# Patient Record
Sex: Male | Born: 1935 | Race: Black or African American | Hispanic: No | Marital: Married | Smoking: Former smoker
Health system: Southern US, Community
[De-identification: ages and names within clinical notes are randomized; demographics above are authoritative.]

## PROBLEM LIST (undated history)

## (undated) DIAGNOSIS — IMO0002 Reserved for concepts with insufficient information to code with codable children: Secondary | ICD-10-CM

## (undated) DIAGNOSIS — I739 Peripheral vascular disease, unspecified: Secondary | ICD-10-CM

## (undated) DIAGNOSIS — I10 Essential (primary) hypertension: Secondary | ICD-10-CM

## (undated) DIAGNOSIS — F0390 Unspecified dementia without behavioral disturbance: Secondary | ICD-10-CM

## (undated) DIAGNOSIS — Z89511 Acquired absence of right leg below knee: Secondary | ICD-10-CM

## (undated) DIAGNOSIS — Z89512 Acquired absence of left leg below knee: Secondary | ICD-10-CM

## (undated) DIAGNOSIS — D631 Anemia in chronic kidney disease: Secondary | ICD-10-CM

## (undated) DIAGNOSIS — I509 Heart failure, unspecified: Secondary | ICD-10-CM

## (undated) DIAGNOSIS — N189 Chronic kidney disease, unspecified: Secondary | ICD-10-CM

## (undated) DIAGNOSIS — I69359 Hemiplegia and hemiparesis following cerebral infarction affecting unspecified side: Secondary | ICD-10-CM

## (undated) DIAGNOSIS — Z992 Dependence on renal dialysis: Secondary | ICD-10-CM

## (undated) DIAGNOSIS — IMO0001 Reserved for inherently not codable concepts without codable children: Secondary | ICD-10-CM

## (undated) HISTORY — PX: LEG AMPUTATION BELOW KNEE: SHX694

---

## 2010-10-23 ENCOUNTER — Emergency Department (HOSPITAL_COMMUNITY): Payer: Medicare Other

## 2010-10-23 ENCOUNTER — Inpatient Hospital Stay (HOSPITAL_COMMUNITY)
Admission: EM | Admit: 2010-10-23 | Discharge: 2010-10-27 | Disposition: A | Discharge status: Other Acute Inpatient Hospital | Payer: Medicare Other | Source: Home / Self Care

## 2010-10-23 DIAGNOSIS — N189 Chronic kidney disease, unspecified: Secondary | ICD-10-CM | POA: Diagnosis present

## 2010-10-23 DIAGNOSIS — I4892 Unspecified atrial flutter: Secondary | ICD-10-CM | POA: Diagnosis present

## 2010-10-23 DIAGNOSIS — M869 Osteomyelitis, unspecified: Secondary | ICD-10-CM | POA: Diagnosis present

## 2010-10-23 DIAGNOSIS — I96 Gangrene, not elsewhere classified: Secondary | ICD-10-CM | POA: Diagnosis present

## 2010-10-23 DIAGNOSIS — E1169 Type 2 diabetes mellitus with other specified complication: Secondary | ICD-10-CM | POA: Diagnosis present

## 2010-10-23 DIAGNOSIS — Z87891 Personal history of nicotine dependence: Secondary | ICD-10-CM

## 2010-10-23 DIAGNOSIS — N179 Acute kidney failure, unspecified: Secondary | ICD-10-CM | POA: Diagnosis present

## 2010-10-23 DIAGNOSIS — M9080 Osteopathy in diseases classified elsewhere, unspecified site: Secondary | ICD-10-CM | POA: Diagnosis present

## 2010-10-23 DIAGNOSIS — Z8249 Family history of ischemic heart disease and other diseases of the circulatory system: Secondary | ICD-10-CM

## 2010-10-23 DIAGNOSIS — R7401 Elevation of levels of liver transaminase levels: Secondary | ICD-10-CM | POA: Diagnosis present

## 2010-10-23 DIAGNOSIS — R7402 Elevation of levels of lactic acid dehydrogenase (LDH): Secondary | ICD-10-CM | POA: Diagnosis present

## 2010-10-23 DIAGNOSIS — E1159 Type 2 diabetes mellitus with other circulatory complications: Secondary | ICD-10-CM | POA: Diagnosis present

## 2010-10-23 DIAGNOSIS — Z794 Long term (current) use of insulin: Secondary | ICD-10-CM

## 2010-10-23 DIAGNOSIS — E875 Hyperkalemia: Secondary | ICD-10-CM | POA: Diagnosis present

## 2010-10-23 DIAGNOSIS — R109 Unspecified abdominal pain: Secondary | ICD-10-CM

## 2010-10-23 DIAGNOSIS — I70269 Atherosclerosis of native arteries of extremities with gangrene, unspecified extremity: Secondary | ICD-10-CM | POA: Diagnosis present

## 2010-10-23 DIAGNOSIS — S88119A Complete traumatic amputation at level between knee and ankle, unspecified lower leg, initial encounter: Secondary | ICD-10-CM

## 2010-10-23 DIAGNOSIS — K8020 Calculus of gallbladder without cholecystitis without obstruction: Secondary | ICD-10-CM | POA: Diagnosis present

## 2010-10-23 DIAGNOSIS — Z23 Encounter for immunization: Secondary | ICD-10-CM

## 2010-10-23 DIAGNOSIS — I129 Hypertensive chronic kidney disease with stage 1 through stage 4 chronic kidney disease, or unspecified chronic kidney disease: Secondary | ICD-10-CM | POA: Diagnosis present

## 2010-10-23 DIAGNOSIS — E876 Hypokalemia: Secondary | ICD-10-CM | POA: Diagnosis not present

## 2010-10-23 DIAGNOSIS — Z833 Family history of diabetes mellitus: Secondary | ICD-10-CM

## 2010-10-23 DIAGNOSIS — I4891 Unspecified atrial fibrillation: Secondary | ICD-10-CM | POA: Diagnosis present

## 2010-10-23 DIAGNOSIS — Z7982 Long term (current) use of aspirin: Secondary | ICD-10-CM

## 2010-10-23 LAB — HEPATIC FUNCTION PANEL
ALT: 47 U/L (ref 0–53)
Alkaline Phosphatase: 300 U/L — ABNORMAL HIGH (ref 39–117)
Bilirubin, Direct: 1.6 mg/dL — ABNORMAL HIGH (ref 0.0–0.3)
Indirect Bilirubin: 1.6 mg/dL — ABNORMAL HIGH (ref 0.3–0.9)

## 2010-10-23 LAB — POCT CARDIAC MARKERS: CKMB, poc: 5.9 ng/mL (ref 1.0–8.0)

## 2010-10-23 LAB — BASIC METABOLIC PANEL
BUN: 79 mg/dL — ABNORMAL HIGH (ref 6–23)
Chloride: 110 mEq/L (ref 96–112)
Glucose, Bld: 142 mg/dL — ABNORMAL HIGH (ref 70–99)
Potassium: 5.3 mEq/L — ABNORMAL HIGH (ref 3.5–5.1)

## 2010-10-23 LAB — DIFFERENTIAL
Basophils Absolute: 0 10*3/uL (ref 0.0–0.1)
Basophils Relative: 0 % (ref 0–1)
Eosinophils Absolute: 0.1 10*3/uL (ref 0.0–0.7)
Monocytes Absolute: 0.5 10*3/uL (ref 0.1–1.0)
Neutrophils Relative %: 70 % (ref 43–77)

## 2010-10-23 LAB — CBC
MCH: 31.1 pg (ref 26.0–34.0)
MCV: 91.3 fL (ref 78.0–100.0)
Platelets: 368 10*3/uL (ref 150–400)
RDW: 15.7 % — ABNORMAL HIGH (ref 11.5–15.5)
WBC: 6.1 10*3/uL (ref 4.0–10.5)

## 2010-10-24 DIAGNOSIS — I96 Gangrene, not elsewhere classified: Secondary | ICD-10-CM

## 2010-10-24 LAB — DIFFERENTIAL
Basophils Absolute: 0 10*3/uL (ref 0.0–0.1)
Lymphocytes Relative: 19 % (ref 12–46)
Monocytes Absolute: 0.5 10*3/uL (ref 0.1–1.0)
Monocytes Relative: 9 % (ref 3–12)
Neutro Abs: 4.1 10*3/uL (ref 1.7–7.7)

## 2010-10-24 LAB — GLUCOSE, CAPILLARY: Glucose-Capillary: 132 mg/dL — ABNORMAL HIGH (ref 70–99)

## 2010-10-24 LAB — BASIC METABOLIC PANEL
CO2: 18 mEq/L — ABNORMAL LOW (ref 19–32)
Chloride: 112 mEq/L (ref 96–112)
GFR calc Af Amer: 42 mL/min — ABNORMAL LOW (ref >60)
Potassium: 5 mEq/L (ref 3.5–5.1)
Sodium: 141 mEq/L (ref 135–145)

## 2010-10-24 LAB — CBC
HCT: 41.3 % (ref 39.0–52.0)
Hemoglobin: 13.8 g/dL (ref 13.0–17.0)
MCHC: 33.4 g/dL (ref 30.0–36.0)

## 2010-10-25 LAB — BASIC METABOLIC PANEL
GFR calc non Af Amer: 33 mL/min — ABNORMAL LOW (ref >60)
Glucose, Bld: 109 mg/dL — ABNORMAL HIGH (ref 70–99)
Potassium: 5.1 mEq/L (ref 3.5–5.1)
Sodium: 144 mEq/L (ref 135–145)

## 2010-10-25 LAB — GLUCOSE, CAPILLARY
Glucose-Capillary: 163 mg/dL — ABNORMAL HIGH (ref 70–99)
Glucose-Capillary: 272 mg/dL — ABNORMAL HIGH (ref 70–99)

## 2010-10-26 ENCOUNTER — Inpatient Hospital Stay (HOSPITAL_COMMUNITY): Payer: Medicare Other

## 2010-10-26 LAB — CBC
MCH: 31.1 pg (ref 26.0–34.0)
MCHC: 33.6 g/dL (ref 30.0–36.0)
Platelets: 293 10*3/uL (ref 150–400)
RDW: 15.8 % — ABNORMAL HIGH (ref 11.5–15.5)

## 2010-10-26 LAB — BASIC METABOLIC PANEL
BUN: 40 mg/dL — ABNORMAL HIGH (ref 6–23)
Chloride: 113 mEq/L — ABNORMAL HIGH (ref 96–112)
GFR calc Af Amer: 48 mL/min — ABNORMAL LOW (ref >60)
Potassium: 4.9 mEq/L (ref 3.5–5.1)
Sodium: 142 mEq/L (ref 135–145)

## 2010-10-26 LAB — GLUCOSE, CAPILLARY: Glucose-Capillary: 210 mg/dL — ABNORMAL HIGH (ref 70–99)

## 2010-10-26 LAB — COMPREHENSIVE METABOLIC PANEL
AST: 68 U/L — ABNORMAL HIGH (ref 0–37)
Albumin: 2.6 g/dL — ABNORMAL LOW (ref 3.5–5.2)
BUN: 48 mg/dL — ABNORMAL HIGH (ref 6–23)
Calcium: 9.5 mg/dL (ref 8.4–10.5)
Creatinine, Ser: 1.87 mg/dL — ABNORMAL HIGH (ref 0.4–1.5)
GFR calc Af Amer: 42 mL/min — ABNORMAL LOW (ref >60)

## 2010-10-26 LAB — DIFFERENTIAL
Basophils Relative: 0 % (ref 0–1)
Eosinophils Absolute: 0.2 10*3/uL (ref 0.0–0.7)
Eosinophils Relative: 2 % (ref 0–5)
Monocytes Absolute: 0.8 10*3/uL (ref 0.1–1.0)
Monocytes Relative: 9 % (ref 3–12)
Neutrophils Relative %: 72 % (ref 43–77)

## 2010-10-26 LAB — VANCOMYCIN, RANDOM: Vancomycin Rm: 14.9 ug/mL

## 2010-10-27 ENCOUNTER — Inpatient Hospital Stay (HOSPITAL_COMMUNITY): Payer: Medicare Other

## 2010-10-27 ENCOUNTER — Inpatient Hospital Stay (HOSPITAL_COMMUNITY)
Admission: AD | Admit: 2010-10-27 | Discharge: 2010-11-03 | DRG: 300 | Disposition: A | Discharge status: Home Health | Payer: Medicare Other | Source: Other Acute Inpatient Hospital | Attending: Internal Medicine | Admitting: Internal Medicine

## 2010-10-27 DIAGNOSIS — I70269 Atherosclerosis of native arteries of extremities with gangrene, unspecified extremity: Secondary | ICD-10-CM

## 2010-10-27 LAB — BASIC METABOLIC PANEL
Calcium: 9.2 mg/dL (ref 8.4–10.5)
GFR calc Af Amer: >60 mL/min (ref >60)
GFR calc non Af Amer: 54 mL/min — ABNORMAL LOW (ref >60)
Sodium: 144 mEq/L (ref 135–145)

## 2010-10-27 LAB — GLUCOSE, CAPILLARY
Glucose-Capillary: 80 mg/dL (ref 70–99)
Glucose-Capillary: 229 mg/dL — ABNORMAL HIGH (ref 70–99)
Glucose-Capillary: 258 mg/dL — ABNORMAL HIGH (ref 70–99)

## 2010-10-28 DIAGNOSIS — I70269 Atherosclerosis of native arteries of extremities with gangrene, unspecified extremity: Secondary | ICD-10-CM

## 2010-10-28 LAB — COMPREHENSIVE METABOLIC PANEL
ALT: 36 U/L (ref 0–53)
AST: 65 U/L — ABNORMAL HIGH (ref 0–37)
Albumin: 2.5 g/dL — ABNORMAL LOW (ref 3.5–5.2)
CO2: 20 mEq/L (ref 19–32)
Calcium: 9.3 mg/dL (ref 8.4–10.5)
Chloride: 114 mEq/L — ABNORMAL HIGH (ref 96–112)
GFR calc Af Amer: >60 mL/min (ref >60)
GFR calc non Af Amer: 53 mL/min — ABNORMAL LOW (ref >60)
Sodium: 142 mEq/L (ref 135–145)

## 2010-10-28 LAB — CBC
Hemoglobin: 12.8 g/dL — ABNORMAL LOW (ref 13.0–17.0)
MCH: 31.6 pg (ref 26.0–34.0)
MCV: 90.6 fL (ref 78.0–100.0)
RBC: 4.05 MIL/uL — ABNORMAL LOW (ref 4.22–5.81)
WBC: 9.5 10*3/uL (ref 4.0–10.5)

## 2010-10-28 LAB — HEPATITIS PANEL, ACUTE
Hep A IgM: NEGATIVE
Hepatitis B Surface Ag: NEGATIVE

## 2010-10-28 LAB — GLUCOSE, CAPILLARY
Glucose-Capillary: 127 mg/dL — ABNORMAL HIGH (ref 70–99)
Glucose-Capillary: 78 mg/dL (ref 70–99)

## 2010-10-28 LAB — PROTIME-INR: Prothrombin Time: 14.7 seconds (ref 11.6–15.2)

## 2010-10-28 NOTE — Discharge Summary (Addendum)
  NAME:  [name] [name], [name] [name]                ACCOUNT NO.:  [id number]  MEDICAL RECORD NO.:  [id number]           PATIENT TYPE:  I  LOCATION:  A312                          FACILITY:  [facility]  PHYSICIAN:  [facility]      DATE OF BIRTH:  03-Jan-1925  DATE OF ADMISSION:  10/23/2010 DATE OF DISCHARGE:  LH                              DISCHARGE SUMMARY   ADDENDUM:  CURRENT MEDICATIONS: 1. Aspirin 81 mg p.o. daily. 2. Cilostazol 100 mg p.o. b.i.d. 3. Cardizem 90 mg p.o. t.i.d. 4. Lantus 20 units subcu at bedtime. 5. Mirtazapine 15 mg p.o. at bedtime. 6. Zosyn per pharmacy. 7. Vancomycin per pharmacy. 8. Oxycodone 5 every 4 hours p.r.n. for pain.     Hartley Barefoot, MD   ______________________________ [facility]    BR/MEDQ  D:  10/27/2010  T:  10/27/2010  Job:  [id number]  Electronically Signed by Hartley Barefoot MD on 10/28/2010 11:21:32 AM

## 2010-10-28 NOTE — Discharge Summary (Addendum)
NAME:  [name] [name], [name] [name]                ACCOUNT NO.:  [id number]  MEDICAL RECORD NO.:  [id number]           PATIENT TYPE:  I  LOCATION:  A312                          FACILITY:  [facility]  PHYSICIAN:  Hartley Barefoot, MD    DATE OF BIRTH:  [birth year]  DATE OF ADMISSION:  10/23/2010 DATE OF DISCHARGE:  03/01/2012LH                              DISCHARGE SUMMARY/ Transfer Note.   REASON FOR TRANSFER:  The patient need evaluation by Vascular. Please contact Dr. Leonides Sake and let him know that the patient is at [facility].  ADMISSION DIAGNOSES: 1. Fourth and fifth toes dry gangrene and questionable osteomyelitis. 2. Acute on chronic renal failure. 3. Transaminases. 4. Diabetes. 5. Atrial flutter/rapid ventricular response. 6. Peripheral vascular disease. 7. 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. 8. Atrial fibrillation.  BRIEF HISTORY OF PRESENT ILLNESS:  This is an 77 year old with past medical history significant for peripheral vascular disease status post left lower extremity below-the-knee amputation, diabetes, hypertension, who presented to the [facility] emergency department complaining of worsening pain of a right lower extremity wound.  He relates that he has had discoloration of his right fourth and fifth toes since 3 weeks prior to admission.  He is complaining of pain.  A home health nurse that has been doing wound care to his lower extremity advised him to come to the emergency department.  He was found to be in atrial flutter with RVR.  RADIOGRAPHIC STUDIES: 1. Chest x-ray on October 23, 2010, showed no acute abnormality. 2. Right foot x-ray on October 23, 2010, showed possible     osteomyelitis of the fourth toe. 3. Arterial ABI, non-calculable ABI due to vascular     noncompressibility, probably secondary to medial calcification.     Consider right lower extremity MRA to better define  the side and     nature of arterial occlusive disease and delineate treatment     options. 4. Abdominal ultrasound showed cholelithiasis without evidence of     acute cholecystitis or biliary obstruction.  Right renal cyst.     Question of elevated right heart pressure.  Question of calcified     granulomata unexplained, with generally inhomogeneous splenic     parenchymal echogenicity, nonspecific but making it difficult to     exclude seroma lesion.  No visualization of the pancreas and aorta.     Better intra-abdominal imaging is required.  Recommend CT with IV     and oral contrast for further assessment.  ASSESSMENT AND PLAN: 1. Fourth and fifth toes dry gangrene, questionable osteomyelitis of     the fourth toe.  The patient was admitted.  He was started on     vancomycin and Zosyn.  Today, October 27, 2010, is day 4 of IV     antibiotics.  Surgery was consulted and they were planning to do     amputation of the fourth and fifth toes.  They think that the dry     gangrene is may be from distal embolization.  ABIs done on     October 26, 2010,  showed possible medial calcification and     they recommended MRA.  I discussed the case with Dr. Imogene Burn,     Vascular at [facility] and he recommended further vascular     evaluation.  He would like to see the patient and he is may be     planning to do a right lower extremity arteriogram and further     treatment will depend on vascular evaluation.  The patient will be     transferred to [facility] for further care.  We will need to inform     Dr. Imogene Burn that the patient has arrived to [facility]. 2. Acute on chronic renal failure.  On admission, the patient's     creatinine was at 2.  With gentle hydration and stopping     spironolactone and lisinopril, his renal function has improved.  On     October 27, 2010, creatinine is at 1.27.  The patient does have a     history of chronic renal failure.  I will avoid to use a lot of     amount of  contrast on this patient.  I will continue to hold     Aldactone and lisinopril.  The patient at this time is on normal     saline lock. 3. Atrial flutter/rapid ventricular response.  On admission, his heart     rate was in the 140s.  He was started initially on Cardizem drip     and admitted to the Step-down Unit.  His heart rate decreased to     the 80s and 60s.  He was transitioned to oral Cardizem at 60 mg     t.i.d.  Today, October 27, 2010, his heart rate started to increase     again and it has been from 112-120.  I increased his Cardizem to 90     mg q.i.d.  After that, his heart rate has been in the 100-110.  I     will give him another dose prior to his transfer to [facility] and     if his heart rate is on reasonable rate, I will transfer him to     [facility]. 4. Hyperkalemia.  The patient had potassium at 5.0-5.6.  This was     likely a combination of medication and acute renal failure.  After     stopping Aldactone and lisinopril, his potassium has decreased.  On     the day of transfer, his potassium is at 4.3. 5. Transaminases.  The patient denies any right upper quadrant     abdominal pain.  Viral hepatitis panel is pending.  Abdominal     ultrasound showed cholelithiasis, no cholecystitis or biliary     obstruction.  He will need a repeated liver function test in the     morning.  We will need to follow viral hepatitis panel.  Further     evaluation depending on results of repeated liver function tests. 6. Questionable calcified granuloma in spleen, versus questionable     mass.  Abdominal ultrasound showed questionable calcified granuloma     in spleen, nonspecific, making it difficult to exclude mass.     They were recommending CT with IV contrast, but due to the     patient's renal failure, MRI was ordered.  MRI showed indeterminate     lesion within the spleen, likely represent cyst or hemangioma.  Differential will also include abscess or granulomatous disease or      sarcoid.  The patient has been afebrile, no leukocytosis, less     likely abscess. 7. On the date of transfer, the patient was in stable condition.  We     will transfer him if his heart rate is in the appropriate range.      He need to be transferred for further vascular evaluation.     Blood pressure 113/62, heart rate 90-104, sat 97% on room air,     temperature 97.4, respirations 18.  Labs; sodium 144, potassium     4.3, chloride 114, CO2 of 19, glucose 85, BUN 25, creatinine 1.27.     Hartley Barefoot, MD     BR/MEDQ  D:  10/27/2010  T:  10/27/2010  Job:  [id number]  Electronically Signed by Hartley Barefoot MD on 10/28/2010 11:28:15 AM

## 2010-10-29 LAB — GLUCOSE, CAPILLARY
Glucose-Capillary: 122 mg/dL — ABNORMAL HIGH (ref 70–99)
Glucose-Capillary: 200 mg/dL — ABNORMAL HIGH (ref 70–99)
Glucose-Capillary: 123 mg/dL — ABNORMAL HIGH (ref 70–99)
Glucose-Capillary: 139 mg/dL — ABNORMAL HIGH (ref 70–99)

## 2010-10-30 LAB — BASIC METABOLIC PANEL
Calcium: 8.9 mg/dL (ref 8.4–10.5)
GFR calc Af Amer: 54 mL/min — ABNORMAL LOW (ref >60)
GFR calc non Af Amer: 44 mL/min — ABNORMAL LOW (ref >60)
Potassium: 3.8 mEq/L (ref 3.5–5.1)
Sodium: 143 mEq/L (ref 135–145)

## 2010-10-30 LAB — GLUCOSE, CAPILLARY
Glucose-Capillary: 213 mg/dL — ABNORMAL HIGH (ref 70–99)
Glucose-Capillary: 114 mg/dL — ABNORMAL HIGH (ref 70–99)

## 2010-10-30 LAB — CBC
HCT: 34.2 % — ABNORMAL LOW (ref 39.0–52.0)
MCHC: 34.2 g/dL (ref 30.0–36.0)
Platelets: 230 10*3/uL (ref 150–400)
RDW: 15.4 % (ref 11.5–15.5)
WBC: 7.8 10*3/uL (ref 4.0–10.5)

## 2010-10-31 DIAGNOSIS — I70269 Atherosclerosis of native arteries of extremities with gangrene, unspecified extremity: Secondary | ICD-10-CM

## 2010-10-31 LAB — GLUCOSE, CAPILLARY: Glucose-Capillary: 151 mg/dL — ABNORMAL HIGH (ref 70–99)

## 2010-10-31 LAB — BASIC METABOLIC PANEL
CO2: 25 mEq/L (ref 19–32)
Chloride: 112 mEq/L (ref 96–112)
GFR calc Af Amer: >60 mL/min (ref >60)
Sodium: 143 mEq/L (ref 135–145)

## 2010-10-31 LAB — CBC
Hemoglobin: 11.5 g/dL — ABNORMAL LOW (ref 13.0–17.0)
MCH: 31.2 pg (ref 26.0–34.0)
RBC: 3.69 MIL/uL — ABNORMAL LOW (ref 4.22–5.81)

## 2010-10-31 LAB — PROTIME-INR: Prothrombin Time: 14.5 seconds (ref 11.6–15.2)

## 2010-10-31 NOTE — Consult Note (Signed)
[name] [name], [name] [name]                ACCOUNT NO.:  [id number]  MEDICAL RECORD NO.:  [id number]           PATIENT TYPE:  I  LOCATION:  2029                         FACILITY:  [facility]  PHYSICIAN:  Fransisco Hertz, MD       DATE OF BIRTH:  [birth year]  DATE OF CONSULTATION:  10/27/2010 DATE OF DISCHARGE:                                CONSULTATION   The consultation is received from the Hospitalist Service at [facility].  REASON FOR CONSULTATION:  Right foot gangrene.  HISTORY OF PRESENT ILLNESS:  This is an 77 year old gentleman, transferred from an outside facility with known peripheral arterial disease that presents with chief complaint of right toes turning black. This gentleman previously has undergone what sounds like a previous attempted bypass in the left leg that eventually failed, and he went on to require amputation on that side.  He notes about 3 weeks ago started developing ischemic changes and his right foot was undergoing wound care and over 3 weeks it has progressed to loss of his great toe nail and also development of black tissue on his lateral surface of his foot and also his fifth and fourth toes, which he attributes to overtight bandages.  At this point, he does not claim any pain in the foot per se and he denies any significant history of claudication.  He, of note, is somewhat limited as a historian as his memory is somewhat limited. Apparently, the majority of his care he gets from [facility].  He denies any fever, chills, or any drainage from this right foot.  PAST MEDICAL HISTORY:  Diabetes, peripheral arterial disease, hypertension, and atrial fibrillation.  PAST SURGICAL HISTORY:  Some type of bypass procedure in the left leg and then a BKA of his left leg.  SOCIAL HISTORY:  He smoked greater than 30 years ago.  Denies any alcohol or illicit drug use.  MEDICATIONS ON TRANSFER:  Aspirin, Pletal, Cardizem, Lantus, Zosyn, vancomycin, oxycodone, and  mirtazapine.  He has no known drug allergies.  FAMILY HISTORY:  The patient is not aware of the medical problems of his father or mother.  REVIEW OF SYSTEMS:  He noted the above items, but otherwise the rest of his review of systems is noted to be negative.  PHYSICAL EXAMINATION:  VITAL SIGNS:  Temperature 98.2, blood pressure 99/63, heart rate of 100, respirations 19, satting 100% on room air. GENERAL:  Appears elderly, in no apparent distress, alert and oriented x3. HEAD:  He is normocephalic, atraumatic.  Some temporalis wasting. ENT:  His oropharynx had no erythema or any exudate.  He has some caries in the sheath.  Nares are without any drainage or any erythema.  Hearing is grossly intact. EYES:  His pupils are equal, round, reactive to light.  Extraocular movements are intact. NECK:  Supple without any nuchal rigidity.  No obvious JVD. PULMONARY:  He has symmetric expansion.  Good air movement.  No rales, rhonchi, or wheezing noted. CARDIAC:  Regular rate and rhythm.  Normal S1 and S2.  No murmurs, rubs, thrills, or gallops are appreciated. VASCULAR:  He does not have  easily palpable radial on his right.  He has easily palpable brachial on his right, however.  I could feel his left radial and his brachial.  Carotids are bilaterally palpable without any bruits. I cannot appreciate his abdominal aorta.  The femorals are easily palpable bilaterally.  I think I feel a faint popliteal on the right. There is no palpable popliteal on the left side.  There are no palpable pedals on the right foot, and there is no foot on the left side as is the BKA. ABDOMINAL:  Soft, nontender, nondistended.  No guarding.  No rebound. No hepatosplenomegaly.  No obvious masses. MUSCULOSKELETAL:  The right foot has at least 4/5 dorsal flexion, plantar flexion.  He has 5/5 hand grip bilaterally.  His right foot demonstrate dry gangrene at the great toe nail base and the entirety of the fourth and  fifth toes and also on the lateral surface of this foot, at roughly the margin of a TMA.  He also has chronic ischemic changes in the skin of the right foot. The left BKA is viable. NEUROLOGIC:  Cranial nerves II-XII are intact.  Motor is as listed above.  Sensation is intact including the right foot, which he still has intact light touch to this foot. PSYCH:  Judgment appear to be intact.  His mood and affect are appropriate for his clinical situation. LYMPHATIC:  He has no cervical axillary or inguinal lymphadenopathy.  He had some laboratory studies including a sodium 144, potassium 4.3, chloride 114, bicarb 19, glucose 85, BUN 25, creatinine is 1.27.  He had a CBC that was done with white count of 8.2, H&H of 13.2 and 39.3, platelets of 293.  He also had what looks like an outside ABI done on February 29, which shows noncompressible vessels, so no ABI was calculated.  No waveforms were noted in this study.  He also had right foot films that demonstrated possible osteomyelitis of the fourth toe on the right side.  MEDICAL DECISION MAKING:  This is an 77 year old gentleman who presents with known peripheral arterial disease that looks like multiple comorbidities including baseline chronic renal insufficiency.  At this point, it looks like most of it has resolved with the creatinine down to 1.3.  This gentleman has extensive dry gangrene in his left foot and ischemic changes.  I do not know if he has a salvageable foot at this point.  The lateral surface gangrene is already at the region of which the TMA would be completed, so I am not certain if he has an adequate plantar surface to complete the TMA successfully.  I would proceed forward with arterial duplex on the right leg as the ABI is going to be useless with his calcific disease.  I would continue hydration in terms of his renal function to improve his chances of avoiding contrast nephropathy.  Unfortunately, at the earliest I  will be able to get him on for a diagnostic angiogram, which is Monday, so we will start the bicarb drip on Sunday and then do angio on Monday with both CO2 and limited amount of dye to fully evaluate this leg and then we will have answer in terms of what his revascularization options are.  I have already discussed with the patient that there is a good chance that he will require right below-the-knee amputation, but he would like attempt at salvage of his foot if at all possible.  We will continue to keep following this gentleman.  At this point, there  is no need for proceeding with any type of guillotine amputation for infection control as the foot demonstrated no evidence of any type of wet gangrene. Additionally, this is not a threatened foot as the leg is warm and he has full motor and sensation intact.  Thank you, once again, for giving the opportunity to participate in this patient's care.     Fransisco Hertz, MD    BLC/MEDQ  D:  10/28/2010  T:  10/28/2010  Job:  [id number]  Electronically Signed by Leonides Sake MD on 10/31/2010 04:41:22 PM

## 2010-11-01 DIAGNOSIS — I70269 Atherosclerosis of native arteries of extremities with gangrene, unspecified extremity: Secondary | ICD-10-CM

## 2010-11-01 LAB — GLUCOSE, CAPILLARY
Glucose-Capillary: 102 mg/dL — ABNORMAL HIGH (ref 70–99)
Glucose-Capillary: 45 mg/dL — ABNORMAL LOW (ref 70–99)

## 2010-11-01 NOTE — Consult Note (Addendum)
NAME:  [name] [name], [name] [name]                ACCOUNT NO.:  [id number]  MEDICAL RECORD NO.:  [id number]           PATIENT TYPE:  I  LOCATION:  A212                          FACILITY:  [facility]  PHYSICIAN:  Vickki Hearing, M.D.DATE OF BIRTH:  [birth year]  DATE OF CONSULTATION:  10/24/2010 DATE OF DISCHARGE:                                CONSULTATION   CONSULT REQUESTED:  By [facility], doctor listed is Dr. Tarry Kos.  REASON FOR CONSULTATION:  Possible osteomyelitis, right foot fourth digit.  History and physical has been reviewed and is incorporated by reference. An 77 year old male peripheral vascular disease, had a left lower extremity below-knee amputation.  He is diabetic, has hypertension.  He is followed in [place] and [address] as well as [place] for various problems, presents with black discoloration of the right fourth and fifth digit of the foot.  He was admitted for workup.  He is a poor historian.  He has a history of atrial fibrillation.  NO ALLERGIES.  He does not smoke or drink.  He lives with his wife.  His medication list is unknown.  In the hospital, he is on aspirin, diltiazem, piperacillin, tazobactam which is a Zosyn, vancomycin, oxycodone.  He basically has dry gangrene of his right fourth and fifth digit of his foot.  He has a weak dorsalis pedis pulse, shiny skin throughout the foot with severe discoloration.  No evidence of cellulitis per se.  His foot is plantigrade.  His x-ray does not show osteomyelitis.  It does show some subluxation of the interphalangeal joint between the middle phalanx and proximal phalanx at the distal portion of the proximal phalanx.  I do not feel there is osteomyelitis.  His lab results are white count of 5.8.  Basic metabolic panel shows glucose 208, potassium 5.  IMPRESSION:  Dry gangrene, right fourth digit and fifth digit of the foot.  Recommend General Surgery consult to determine if the  patient needs possible amputation.     Vickki Hearing, M.D.     SEH/MEDQ  D:  10/24/2010  T:  10/25/2010  Job:  [id number]  Electronically Signed by Fuller Canada M.D. on 11/01/2010 11:41:06 AM Electronically Signed by Fuller Canada M.D. on 11/01/2010 11:58:56 AM Electronically Signed by Fuller Canada M.D. on 11/01/2010 12:13:24 PM Electronically Signed by Fuller Canada M.D. on 11/01/2010 12:29:10 PM Electronically Signed by Fuller Canada M.D. on 11/01/2010 12:46:26 PM Electronically Signed by Fuller Canada M.D. on 11/01/2010 01:04:27 PM Electronically Signed by Fuller Canada M.D. on 11/01/2010 01:24:43 PM Electronically Signed by Fuller Canada M.D. on 11/01/2010 01:46:13 PM Electronically Signed by Fuller Canada M.D. on 11/01/2010 02:06:01 PM Electronically Signed by Fuller Canada M.D. on 11/01/2010 04:54:09 PM Electronically Signed by Fuller Canada M.D. on 11/01/2010 02:49:22 PM Electronically Signed by Fuller Canada M.D. on 11/01/2010 03:13:25 PM Electronically Signed by Fuller Canada M.D. on 11/01/2010 03:38:06 PM Electronically Signed by Fuller Canada M.D. on 11/01/2010 04:13:46 PM Electronically Signed by Fuller Canada M.D. on 11/01/2010 04:45:04 PM Electronically Signed by Fuller Canada M.D. on 11/01/2010 05:18:18 PM Electronically Signed by Fuller Canada M.D. on 11/01/2010 05:18:18 PM Electronically  Signed by Fuller Canada M.D. on 11/01/2010 07:36:58 PM

## 2010-11-02 DIAGNOSIS — I96 Gangrene, not elsewhere classified: Secondary | ICD-10-CM

## 2010-11-02 DIAGNOSIS — I70269 Atherosclerosis of native arteries of extremities with gangrene, unspecified extremity: Secondary | ICD-10-CM

## 2010-11-02 LAB — BASIC METABOLIC PANEL
CO2: 28 mEq/L (ref 19–32)
GFR calc non Af Amer: 51 mL/min — ABNORMAL LOW (ref >60)
Glucose, Bld: 94 mg/dL (ref 70–99)
Potassium: 3.8 mEq/L (ref 3.5–5.1)
Sodium: 141 mEq/L (ref 135–145)

## 2010-11-02 LAB — GLUCOSE, CAPILLARY
Glucose-Capillary: 100 mg/dL — ABNORMAL HIGH (ref 70–99)
Glucose-Capillary: 145 mg/dL — ABNORMAL HIGH (ref 70–99)

## 2010-11-02 LAB — CBC
HCT: 33 % — ABNORMAL LOW (ref 39.0–52.0)
Hemoglobin: 11.3 g/dL — ABNORMAL LOW (ref 13.0–17.0)
RDW: 15.4 % (ref 11.5–15.5)
WBC: 6.1 10*3/uL (ref 4.0–10.5)

## 2010-11-03 LAB — GLUCOSE, CAPILLARY: Glucose-Capillary: 108 mg/dL — ABNORMAL HIGH (ref 70–99)

## 2010-11-04 NOTE — Op Note (Signed)
NAMECERVANDO, DURNIN                ACCOUNT NO.:  [id number]  MEDICAL RECORD NO.:  [id number]           PATIENT TYPE:  LOCATION:                                 FACILITY:  PHYSICIAN:  Fransisco Hertz, MD       DATE OF BIRTH:  [birth year]  DATE OF PROCEDURE: DATE OF DISCHARGE:                              OPERATIVE REPORT   PROCEDURE: 1. Left common femoral artery cannulation under ultrasound guidance. 2. Aortogram with CO2 contrast. 3. Water arterial selection. 4. Right leg angiogram with carbon dioxide and IV dye contrast.  PREOPERATIVE DIAGNOSIS:  Right foot gangrene.  POSTOPERATIVE DIAGNOSIS:  Right foot gangrene.  SURGEON:  Arlys John L. Imogene Burn, MD  CONTRAST:  36 mL.  ANESTHESIA:  Local.  ESTIMATED BLOOD LOSS:  Minimal.  SPECIMENS:  None.  FINDINGS: 1. Patent aorta. 2. Patent SMA and bilateral renal arteries. 3. Patent bilateral common iliac artery. 4. Patent right external iliac artery and proximal right internal     iliac artery. 5. Patent right common femoral profunda and superficial femoral     arteries. 6. Midthigh right superficial femoral artery stenosis less than 50%. 7. There is a patent right popliteal artery throughout. 8. Diseased but patent trifurcation. 9. Right anterior tibial artery occludes a few centimeters at the     takeoff. 10.Severe atherosclerotic aortic disease throughout the right     tibioperoneal trunk. 11.The right posterior tibial artery occludes. 12.There is a very diseased peroneal artery which continues down the     leg and occludes above the level of the knee. 13.Reconstitution tarsal artery on dorsum unfortunately was less than     2 mm in diameter.  INDICATIONS:  This is an 77 year old gentleman status post previous left BKA who presents with right foot gangrene, was transferred to this facility for evaluation for possible revascularization.  Based on this exam, when I saw him upon transfer, I already had concerns whether or not  his right foot was salvageable even with revascularization, but the patient insisted on an attempt at revascularization so he agreed to proceed forward with an aortogram by right leg runoff.  Due to his acute on chronic renal insufficiency, he was started on bicarbonate drip since Sunday night.  The plan was to use a combination of carbon dioxide and diluted contrast for his arterial study.  The patient is aware of the risks of the procedure, possible acute renal failure, possible bleeding, infection, possible access complications, possible embolization, possible rupture of any treated vessels and possible need for emergent interventions surgically.  He is aware of such and agreed to proceed forward with such.  DESCRIPTION OF THE OPERATION:  After full informed written consent had been obtained from the patient, he was brought back to the angio suite and placed supine upon the angio table.  After connecting the patient to  monitoring equipment, he was prepped and draped in standard fashion for aortogram and bilateral leg runoff.  I turned my attention first to the left groin.  Under ultrasound guidance, I was able to cannulate the common femoral artery with an 18-gauge needle.  I passed  the Bentson wire up into the aorta.  The needle was exchanged for a 5-French sheath and then an Omni flush catheter was loaded over the wire and advanced to the level of L1.  The wire was then removed and then the catheter was connected to the carbon dioxide injections circuit and then hand injections of carbon dioxide were completed to perform the carbon dioxide aortogram, the findings of which are listed above.  I then exchanged the catheter out for a crossover catheter using this and the wire was able to select out the right common iliac artery. Then, we advanced the wire down into the external iliac artery.  The catheter was exchanged for a 4-French end-hole catheter.  This was placed over the wire and  advanced down into the external iliac artery. Another hand injection was completed with carbon dioxide to verify the right external iliac artery was patent, findings of which are listed above.  I then using this wire and the catheter was able to navigate into the superficial femoral artery.  We continued with carbon dioxide injections distally in the right leg in stations.  Eventually the carbon dioxide was no longer able to discern the details, past the knee so at this point we switched over to contrast.  The hand injections were completed and the findings of those injections are as listed above.  Based on these findings, I do not feel that an endovascular or surgical option is available to reconstruct this patient's vasculature in his lower leg and unfortunately he will need to have a right below-the-knee amputation completed to treat his gangrenous right foot.  COMPLICATIONS:  None.  CONDITION:  Stable.     Fransisco Hertz, MD     BLC/MEDQ  D:  10/31/2010  T:  10/31/2010  Job:  [id number]  Electronically Signed by Leonides Sake MD on 11/03/2010 10:25:42 AM

## 2010-11-09 NOTE — Progress Notes (Signed)
  NAME:  [name] [name], [name] [name]                ACCOUNT NO.:  [id number]  MEDICAL RECORD NO.:  [id number]           PATIENT TYPE:  I  LOCATION:  2029                         FACILITY:  [facility]  PHYSICIAN:  Thad Ranger, MD       DATE OF BIRTH:  1925/02/10                                PROGRESS NOTE   Please note that the patient was transferred from [facility] on October 27, 2010.  CURRENT DIAGNOSES: 1. Fourth and fifth toes dry gangrene and questionable osteomyelitis. 2. Severe peripheral vascular disease. 3. Diabetes with hypoglycemia. 4. Atrial flutter/fibrillation currently rate controlled. 5. Hypokalemia, replaced. 6. Hypertension. 7. History of left below-knee amputation.  CONSULTATIONS: Vascular surgery, Dr. Leonides Sake.  Briefly, [name] [name] is an 77 year old male who had originally presented to [facility] Emergency Department, then transferred to [facility] with worsening pain in the right lower extremity wound and he was noted to have gangrene of right fourth infected toe for 3 weeks prior to admission.  The patient was discharged from [facility] on October 27, 2010, for vascular surgery evaluation. Radiological data, MRI of the abdomen showed indeterminate lesion within the spleen, likely represent cyst or hemangioma.  Differential was including abscess or granulomatous disease.  The liver appears normal.  The patient also underwent aortogram on November 01, 2010, which showed severe peripheral vascular disease throughout right CVI. Briefly, this patient underwent aortogram on November 01, 2010, this morning, which shows severe peripheral vascular disease in the right .  BRIEF HOSPITALIZATION COURSE: 1. Severe peripheral vascular disease.  The patient underwent     angiogram, which did confirm the severe proximal peripheral     vascular disease and per vascular surgery there is no     revascularization option for the right foot and the patient will     require  BKA.  The patient at this moment is currently still     reluctant undergo the amputation, and I discussed in detail with     the patient and his family.  Vascular surgery will readdress in the     a.m. 2. Diabetes mellitus with hypoglycemia.  This is better, will decrease     in Lantus today, and change the  sliding scale to sensitive. 3. Hypokalemia, replaced. 4. History of atrial flutter/fibrillation, currently rate controlled.     He is on aspirin and Cardizem. 5. Acute renal insufficiency, this has improved.  Please see the     previous discharge summary from [facility] for the above     details.  DISCHARGE DISPOSITION: Currently not ready.  Discharge medications will be dictated at the actual time of discharge.     Thad Ranger, MD     RR/MEDQ  D:  11/01/2010  T:  11/02/2010  Job:  [id number]  Electronically Signed by RIPUDEEP RAI  on 11/09/2010 07:59:01 AM

## 2010-12-28 NOTE — Discharge Summary (Signed)
  NAMEGILLIAM, HAWKES                ACCOUNT NO.:  [id number]  MEDICAL RECORD NO.:  [id number]           PATIENT TYPE:  I  LOCATION:  2029                         FACILITY:  [facility]  PHYSICIAN:  Ladell Pier, M.D.   DATE OF BIRTH:  [birth year]  DATE OF ADMISSION:  10/27/2010 DATE OF DISCHARGE:  11/03/2010                              DISCHARGE SUMMARY   ADDENDUM  The patient was discharged on November 03, 2010.  The patient continues to refuse amputation.  He states that he needs a second opinion.  Vascular signed off.  Did consult Infectious Disease to help with antibiotic therapy.  Per Dr. Orvan Falconer, antibiotic most likely will not help with his gangrene/ischemic foot, but recommend discharging him on Augmentin twice a day for 17 more days as he is already received 10 days of vanc and Zosyn.  So, we will discharge him on Augmentin b.i.d., gave him a prescription for Augmentin.  He will follow up with his PCP [facility].  He also has a Publishing rights manager that checks on him at home.  Medications at the time of discharge: 1. Augmentin 875 b.i.d. x17 days. 2. Percocet 5/325 one to two every 3 hours as needed for pain. 3. Crestor 5 mg daily. 4. Lantus 10 units at bedtime. 5. Amlodipine 5 mg daily. 6. Aspirin 81 mg daily. 7. Cilostazol 100 mg twice daily. 8. Lisinopril 20 mg daily. 9. Mirtazapine 15 mg at bedtime. 10.Spironolactone 25 mg twice daily.  Labs at the time of discharge, BNP 141, __________ 3.8, chloride 109, CO2 of 28, glucose 94, BUN 18, creatinine 1.34, calcium 8.6, WBC 6.1, hemoglobin 11.3, MCV 90.9, platelet 149.  Time spent with patient and doing discharge is approximately 30 minutes.     Ladell Pier, M.D.     [place]/MEDQ  D:  11/03/2010  T:  11/04/2010  Job:  [id number]  cc:   [facility]  Electronically Signed by Virginia Rochester M.D. on 12/28/2010 03:46:05 PM

## 2011-01-03 ENCOUNTER — Emergency Department (HOSPITAL_COMMUNITY)

## 2011-01-03 ENCOUNTER — Inpatient Hospital Stay (HOSPITAL_COMMUNITY)
Admission: EM | Admit: 2011-01-03 | Discharge: 2011-01-21 | DRG: 239 | Disposition: A | Discharge status: Skilled Nursing Facility | Attending: Internal Medicine | Admitting: Internal Medicine

## 2011-01-03 DIAGNOSIS — M869 Osteomyelitis, unspecified: Secondary | ICD-10-CM | POA: Diagnosis present

## 2011-01-03 DIAGNOSIS — M9080 Osteopathy in diseases classified elsewhere, unspecified site: Secondary | ICD-10-CM | POA: Diagnosis present

## 2011-01-03 DIAGNOSIS — Z794 Long term (current) use of insulin: Secondary | ICD-10-CM

## 2011-01-03 DIAGNOSIS — I509 Heart failure, unspecified: Secondary | ICD-10-CM | POA: Diagnosis present

## 2011-01-03 DIAGNOSIS — N039 Chronic nephritic syndrome with unspecified morphologic changes: Secondary | ICD-10-CM | POA: Diagnosis present

## 2011-01-03 DIAGNOSIS — N471 Phimosis: Secondary | ICD-10-CM | POA: Diagnosis present

## 2011-01-03 DIAGNOSIS — I120 Hypertensive chronic kidney disease with stage 5 chronic kidney disease or end stage renal disease: Secondary | ICD-10-CM | POA: Diagnosis present

## 2011-01-03 DIAGNOSIS — N478 Other disorders of prepuce: Secondary | ICD-10-CM | POA: Diagnosis present

## 2011-01-03 DIAGNOSIS — R338 Other retention of urine: Secondary | ICD-10-CM | POA: Diagnosis present

## 2011-01-03 DIAGNOSIS — I4891 Unspecified atrial fibrillation: Secondary | ICD-10-CM | POA: Diagnosis present

## 2011-01-03 DIAGNOSIS — I4892 Unspecified atrial flutter: Secondary | ICD-10-CM | POA: Diagnosis present

## 2011-01-03 DIAGNOSIS — E1159 Type 2 diabetes mellitus with other circulatory complications: Principal | ICD-10-CM | POA: Diagnosis present

## 2011-01-03 DIAGNOSIS — E872 Acidosis, unspecified: Secondary | ICD-10-CM | POA: Diagnosis present

## 2011-01-03 DIAGNOSIS — I5023 Acute on chronic systolic (congestive) heart failure: Secondary | ICD-10-CM | POA: Diagnosis present

## 2011-01-03 DIAGNOSIS — I70269 Atherosclerosis of native arteries of extremities with gangrene, unspecified extremity: Secondary | ICD-10-CM | POA: Diagnosis present

## 2011-01-03 DIAGNOSIS — E1169 Type 2 diabetes mellitus with other specified complication: Secondary | ICD-10-CM | POA: Diagnosis present

## 2011-01-03 DIAGNOSIS — D631 Anemia in chronic kidney disease: Secondary | ICD-10-CM | POA: Diagnosis present

## 2011-01-03 DIAGNOSIS — N179 Acute kidney failure, unspecified: Secondary | ICD-10-CM | POA: Diagnosis present

## 2011-01-03 DIAGNOSIS — N186 End stage renal disease: Secondary | ICD-10-CM | POA: Diagnosis present

## 2011-01-03 DIAGNOSIS — Z992 Dependence on renal dialysis: Secondary | ICD-10-CM

## 2011-01-03 DIAGNOSIS — F0390 Unspecified dementia without behavioral disturbance: Secondary | ICD-10-CM | POA: Diagnosis present

## 2011-01-03 LAB — BASIC METABOLIC PANEL
CO2: 20 mEq/L (ref 19–32)
Chloride: 100 mEq/L (ref 96–112)
GFR calc Af Amer: 22 mL/min — ABNORMAL LOW (ref >60)
Potassium: 3.8 mEq/L (ref 3.5–5.1)

## 2011-01-03 LAB — GLUCOSE, CAPILLARY
Glucose-Capillary: 159 mg/dL — ABNORMAL HIGH (ref 70–99)
Glucose-Capillary: 115 mg/dL — ABNORMAL HIGH (ref 70–99)

## 2011-01-03 LAB — DIFFERENTIAL
Basophils Relative: 0 % (ref 0–1)
Eosinophils Absolute: 0.1 10*3/uL (ref 0.0–0.7)
Lymphs Abs: 0.8 10*3/uL (ref 0.7–4.0)
Neutro Abs: 6.4 10*3/uL (ref 1.7–7.7)
Neutrophils Relative %: 82 % — ABNORMAL HIGH (ref 43–77)

## 2011-01-03 LAB — CBC
Hemoglobin: 9.9 g/dL — ABNORMAL LOW (ref 13.0–17.0)
MCV: 93.9 fL (ref 78.0–100.0)
Platelets: 296 10*3/uL (ref 150–400)
RBC: 3.12 MIL/uL — ABNORMAL LOW (ref 4.22–5.81)
WBC: 7.8 10*3/uL (ref 4.0–10.5)

## 2011-01-04 LAB — BASIC METABOLIC PANEL
Chloride: 102 mEq/L (ref 96–112)
Creatinine, Ser: 3.17 mg/dL — ABNORMAL HIGH (ref 0.4–1.5)
GFR calc Af Amer: 23 mL/min — ABNORMAL LOW (ref >60)
Potassium: 4 mEq/L (ref 3.5–5.1)
Sodium: 138 mEq/L (ref 135–145)

## 2011-01-04 LAB — GLUCOSE, CAPILLARY
Glucose-Capillary: 117 mg/dL — ABNORMAL HIGH (ref 70–99)
Glucose-Capillary: 161 mg/dL — ABNORMAL HIGH (ref 70–99)
Glucose-Capillary: 128 mg/dL — ABNORMAL HIGH (ref 70–99)

## 2011-01-04 LAB — DIFFERENTIAL
Lymphocytes Relative: 13 % (ref 12–46)
Lymphs Abs: 1 10*3/uL (ref 0.7–4.0)
Neutrophils Relative %: 78 % — ABNORMAL HIGH (ref 43–77)

## 2011-01-04 LAB — CBC
HCT: 30.4 % — ABNORMAL LOW (ref 39.0–52.0)
Hemoglobin: 10.2 g/dL — ABNORMAL LOW (ref 13.0–17.0)
MCV: 95.3 fL (ref 78.0–100.0)
Platelets: 335 10*3/uL (ref 150–400)
RBC: 3.19 MIL/uL — ABNORMAL LOW (ref 4.22–5.81)
WBC: 7.6 10*3/uL (ref 4.0–10.5)

## 2011-01-04 LAB — MAGNESIUM: Magnesium: 2.8 mg/dL — ABNORMAL HIGH (ref 1.5–2.5)

## 2011-01-04 LAB — APTT: aPTT: 38 seconds — ABNORMAL HIGH (ref 24–37)

## 2011-01-04 LAB — CREATININE, URINE, RANDOM: Creatinine, Urine: 173.8 mg/dL

## 2011-01-04 NOTE — H&P (Signed)
NAMEBERNARD, [name] [name]                ACCOUNT NO.:  [id number]  MEDICAL RECORD NO.:  [id number]           PATIENT TYPE:  I  LOCATION:  A302                          FACILITY:  [facility]  PHYSICIAN:  Vania Rea, M.D. DATE OF BIRTH:  05-08-1925  DATE OF ADMISSION:  01/03/2011 DATE OF DISCHARGE:  LH                             HISTORY & PHYSICAL   PRIMARY CARE PHYSICIAN:  Dr. Jorene Guest with [facility].  CHIEF COMPLAINT:  Gangrene of the right foot, wanting surgery.  HISTORY OF PRESENT ILLNESS:  This is an 77 year old African American gentleman with known ischemic and diabetic gangrene of the right foot who was thoroughly evaluated by the Hospitalist and Vascular Services at [facility] in March of this year, who after having had an arteriogram showing extensive peripheral vascular disease, was offered BKA as the only means of salvaging functionality and treating  osteomyelitis.  The patient at that time declined that radical surgery and has constantly been insisting that he only needs to have the right fourth and fifth toes removed.  Eventually, the patient was discharged home.  He is on antibiotics for the osteomyelitis of the toes, although it was the opinion of the Infectious Disease Service that antibiotics probably would not be of much help.  The patient has been managed at home since then, has been having intermittent pain of the toes and of course difficulty with ambulation and eventually presented to the emergency room not only with pain, but also requesting surgery.  During the course of this interview, however, the patient has been backwards and forwards as to whether he wishes to have simple amputation of the toes, amputation of the foot, or a complete right BKA.  The patient denies any fever, cough, or cold.  He denies any chest pains or shortness of breath.  He reports that he has been eating and drinking normally, has been having no  diarrhea, has been taking all of his medications as prescribed.  She is cared for at home by his wife.  He apparently also does have a hospice nurse. He confirms code status as FULL CODE.  Although the patient appears very competent and is able to discuss his medical condition in a very reasonable way.  When checking his orientation, he describes this as November 2002 and when reoriented to the fact that it is actually May 2012, he brushes off the correction by saying he simply does not keep up with the dates.  When asked the name of the hospital, he knows it is [facility], but was not able to recall the name of the hospital.  He does therefore probably does have some mild degree of dementia.  PAST MEDICAL HISTORY: 1. Diabetes type 2. 2. Hypertension. 3. Peripheral vascular disease. 4. Dry gangrene of the right foot, chronic. 5. Osteomyelitis of the fourth and fifth toes. 6. Atrial flutter with variable rate control. 7. He is status post left below-knee amputation.  MEDICATIONS: 1. Percocet 5/325 four times daily. 2. Furosemide 20 mg twice daily. 3. Metoprolol 25 mg twice daily. 4. Simvastatin 40 mg at bedtime. 5. Lisinopril 20 mg  daily. 6. Spironolactone 25 mg twice daily. 7. Cilostazol 100 mg twice daily. 8. Coated aspirin one tablet daily. 9. Clindamycin 300 mg three times daily. 10.Insulin unknown dose twice daily.  The patient describes 20 units     in the morning and 15 units in the evening.  ALLERGIES:  No known drug allergies.  SOCIAL HISTORY:  He is a retired Engineer, petroleum.  Says, he discontinued smoking 20 years ago, but never used to smoke very much less than half a pack a day when he smoked.  Denies alcohol or illicit drug use.  Currently living with his wife, [name] [name], they have been married over 60 years.  His code status is full cold.  FAMILY HISTORY:  Reports that all his family were healthy.  No one had any serious medical  problems.  REVIEW OF SYSTEMS:  Other than noted above unremarkable.  PHYSICAL EXAMINATION:  GENERAL:  Pleasant, elderly African American gentleman, lying flat in the stretcher, no acute distress. VITAL SIGNS:  His temperature is 97.5, his pulse is 110, respiration 18, blood pressure 108/72, initially 93/63, but he did receive fluids, he is saturating at 99% on room air. HEENT:  His pupils are round and equal.  Mucous membranes pink. Anicteric.  He is moderately dehydrated. NECK:  No cervical lymphadenopathy.  No thyromegaly.  No carotid bruits. CHEST:  Clear to auscultation bilaterally. CARDIOVASCULAR SYSTEM:  Regular rhythm.  No murmur. ABDOMEN:  Obese, soft, and nontender.  No masses. EXTREMITIES:  He has trace edema of the left BKA stump and well-healed scar running of the medial aspect of the stump and thigh.  Right lower extremity shows extensive ischemic change and hyperpigmentation from toe to knee with necrosis of the right fourth and fifth toes.  There is no discharge.  The leg is warm.  Pulses were not felt.  There is trace edema of the leg.  His skin is otherwise free from blemish. CENTRAL NERVOUS SYSTEM:  Cranial nerves II-XII are grossly intact and he has grade 5 power in both upper extremities bilaterally and is able to move both lower extremities equally.  LABORATORY DATA:  His white count is 7.8, hemoglobin 9.9, platelets 296. He has 82% neutrophils.  Absolute neutrophil count is normal at 6.4. There is no comments on the morphology.  His sodium is 134, potassium 3.8, chloride 100, CO2 20, glucose 148, BUN elevated at 48.  His creatinine is elevated to 1.35-3.31, it was 1.3 two months ago.  His calcium is 9.4.  X-rays of the right foot show progressive bony destruction involving the proximal and middle phalanges of the fourth toe compatible with osteomyelitis with complete destruction of the PIP joint with lateral dislocation of the middle phalanx.  There is diffuse  soft tissue swelling of the right foot.  Chest x-ray shows enlargement of the cardiac silhouette and bibasilar atelectasis.  His EKG shows atrial flutter with variable AV conduction.  ASSESSMENT: 1. Acute renal failure. 2. Diabetic gangrene of multifactorial etiology including ischemia and     infection. 3. Peripheral vascular disease. 4. Chronic osteomyelitis of the right foot. 5. Diabetes type 2. 6. Chronic atrial flutter, not on anticoagulation. 7. Hypertension. 8. Chronic anemia. 9. Mild dementia.  PLAN: 1. We will discontinue nephrotoxic drugs such as furosemide,     lisinopril and spironolactone, and hydrate him vigorously.  2. If his heart rate does not improve with simple hydration, and his blood     pressure can tolerate it , we  will consider restarting his     metoprolol or adding Cardizem. 3. We will start him empirically on vancomycin and Zosyn. 4. Will consult the surgeons in the morning either to proceed with right     BKA if he agrees or transferred to [facility] for BKA by the vascular     surgeons. 5. We will keep him on a sensitive sliding scale for the time being. 6. Will give prophylactic, but withhold full anticoagulation at this time. 7. Other plans as per orders.     Vania Rea, M.D.LC/MEDQ  D:  01/03/2011  T:  01/03/2011  Job:  [id number]  cc:   Dr. Adria Dill, MD  Electronically Signed by Vania Rea M.D. on 01/04/2011 05:53:57 AM

## 2011-01-05 ENCOUNTER — Other Ambulatory Visit: Payer: Self-pay | Admitting: General Surgery

## 2011-01-05 LAB — URINE MICROSCOPIC-ADD ON

## 2011-01-05 LAB — COMPREHENSIVE METABOLIC PANEL
AST: 86 U/L — ABNORMAL HIGH (ref 0–37)
Albumin: 2.4 g/dL — ABNORMAL LOW (ref 3.5–5.2)
BUN: 54 mg/dL — ABNORMAL HIGH (ref 6–23)
Calcium: 9.4 mg/dL (ref 8.4–10.5)
Chloride: 105 mEq/L (ref 96–112)
Creatinine, Ser: 2.9 mg/dL — ABNORMAL HIGH (ref 0.4–1.5)
GFR calc Af Amer: 25 mL/min — ABNORMAL LOW (ref >60)
Total Protein: 7.7 g/dL (ref 6.0–8.3)

## 2011-01-05 LAB — GLUCOSE, CAPILLARY
Glucose-Capillary: 110 mg/dL — ABNORMAL HIGH (ref 70–99)
Glucose-Capillary: 92 mg/dL (ref 70–99)
Glucose-Capillary: 96 mg/dL (ref 70–99)
Glucose-Capillary: 94 mg/dL (ref 70–99)

## 2011-01-05 LAB — URINALYSIS, ROUTINE W REFLEX MICROSCOPIC
Protein, ur: 30 mg/dL — AB
Urobilinogen, UA: 1 mg/dL (ref 0.0–1.0)

## 2011-01-05 LAB — PHOSPHORUS: Phosphorus: 4.5 mg/dL (ref 2.3–4.6)

## 2011-01-05 LAB — CBC
HCT: 30.1 % — ABNORMAL LOW (ref 39.0–52.0)
MCH: 32.1 pg (ref 26.0–34.0)
MCH: 31.8 pg (ref 26.0–34.0)
MCV: 95.5 fL (ref 78.0–100.0)
MCV: 95.9 fL (ref 78.0–100.0)
Platelets: 320 10*3/uL (ref 150–400)
RBC: 3.33 MIL/uL — ABNORMAL LOW (ref 4.22–5.81)
RDW: 17.6 % — ABNORMAL HIGH (ref 11.5–15.5)
RDW: 17.8 % — ABNORMAL HIGH (ref 11.5–15.5)
WBC: 8.1 10*3/uL (ref 4.0–10.5)
WBC: 7.9 10*3/uL (ref 4.0–10.5)

## 2011-01-05 LAB — DIFFERENTIAL
Basophils Relative: 0 % (ref 0–1)
Basophils Relative: 0 % (ref 0–1)
Eosinophils Absolute: 0.1 10*3/uL (ref 0.0–0.7)
Eosinophils Relative: 2 % (ref 0–5)
Eosinophils Relative: 1 % (ref 0–5)
Lymphs Abs: 0.8 10*3/uL (ref 0.7–4.0)
Lymphs Abs: 1 10*3/uL (ref 0.7–4.0)
Monocytes Absolute: 0.9 10*3/uL (ref 0.1–1.0)
Monocytes Relative: 6 % (ref 3–12)
Monocytes Relative: 12 % (ref 3–12)
Neutro Abs: 5.9 10*3/uL (ref 1.7–7.7)
Neutrophils Relative %: 82 % — ABNORMAL HIGH (ref 43–77)

## 2011-01-05 LAB — BASIC METABOLIC PANEL
BUN: 53 mg/dL — ABNORMAL HIGH (ref 6–23)
Creatinine, Ser: 2.73 mg/dL — ABNORMAL HIGH (ref 0.4–1.5)
GFR calc non Af Amer: 22 mL/min — ABNORMAL LOW (ref >60)
Glucose, Bld: 90 mg/dL (ref 70–99)
Potassium: 4 mEq/L (ref 3.5–5.1)

## 2011-01-06 LAB — BASIC METABOLIC PANEL
CO2: 18 mEq/L — ABNORMAL LOW (ref 19–32)
Calcium: 9.2 mg/dL (ref 8.4–10.5)
GFR calc Af Amer: 27 mL/min — ABNORMAL LOW (ref >60)
GFR calc non Af Amer: 22 mL/min — ABNORMAL LOW (ref >60)
Potassium: 3.9 mEq/L (ref 3.5–5.1)
Sodium: 141 mEq/L (ref 135–145)

## 2011-01-06 LAB — GLUCOSE, CAPILLARY
Glucose-Capillary: 108 mg/dL — ABNORMAL HIGH (ref 70–99)
Glucose-Capillary: 122 mg/dL — ABNORMAL HIGH (ref 70–99)

## 2011-01-06 LAB — HEMOGLOBIN AND HEMATOCRIT, BLOOD: Hemoglobin: 10.5 g/dL — ABNORMAL LOW (ref 13.0–17.0)

## 2011-01-07 ENCOUNTER — Inpatient Hospital Stay (HOSPITAL_COMMUNITY)

## 2011-01-07 LAB — CBC
HCT: 29.4 % — ABNORMAL LOW (ref 39.0–52.0)
Hemoglobin: 9.7 g/dL — ABNORMAL LOW (ref 13.0–17.0)
MCH: 32 pg (ref 26.0–34.0)
MCHC: 33 g/dL (ref 30.0–36.0)
MCV: 97 fL (ref 78.0–100.0)

## 2011-01-07 LAB — GLUCOSE, CAPILLARY

## 2011-01-07 LAB — BASIC METABOLIC PANEL
CO2: 17 mEq/L — ABNORMAL LOW (ref 19–32)
Chloride: 108 mEq/L (ref 96–112)
GFR calc Af Amer: 25 mL/min — ABNORMAL LOW (ref >60)
Sodium: 138 mEq/L (ref 135–145)

## 2011-01-07 LAB — DIFFERENTIAL
Lymphocytes Relative: 8 % — ABNORMAL LOW (ref 12–46)
Monocytes Absolute: 0.8 10*3/uL (ref 0.1–1.0)
Monocytes Relative: 8 % (ref 3–12)
Neutro Abs: 8.8 10*3/uL — ABNORMAL HIGH (ref 1.7–7.7)

## 2011-01-07 LAB — RENAL FUNCTION PANEL
CO2: 19 mEq/L (ref 19–32)
Chloride: 109 mEq/L (ref 96–112)
Glucose, Bld: 174 mg/dL — ABNORMAL HIGH (ref 70–99)
Potassium: 4.8 mEq/L (ref 3.5–5.1)
Sodium: 141 mEq/L (ref 135–145)

## 2011-01-08 LAB — BASIC METABOLIC PANEL
CO2: 19 mEq/L (ref 19–32)
Calcium: 9.2 mg/dL (ref 8.4–10.5)
GFR calc Af Amer: 22 mL/min — ABNORMAL LOW (ref >60)
Sodium: 137 mEq/L (ref 135–145)

## 2011-01-08 LAB — CROSSMATCH
ABO/RH(D): O POS
Unit division: 0

## 2011-01-08 LAB — DIFFERENTIAL
Basophils Absolute: 0 10*3/uL (ref 0.0–0.1)
Basophils Relative: 0 % (ref 0–1)
Eosinophils Absolute: 0.1 10*3/uL (ref 0.0–0.7)
Monocytes Absolute: 0.9 10*3/uL (ref 0.1–1.0)
Neutro Abs: 7.6 10*3/uL (ref 1.7–7.7)
Neutrophils Relative %: 82 % — ABNORMAL HIGH (ref 43–77)

## 2011-01-08 LAB — VITAMIN B12: Vitamin B-12: >2000 pg/mL — ABNORMAL HIGH (ref 211–911)

## 2011-01-08 LAB — CBC
Hemoglobin: 9.8 g/dL — ABNORMAL LOW (ref 13.0–17.0)
MCHC: 32.9 g/dL (ref 30.0–36.0)
Platelets: 292 10*3/uL (ref 150–400)
RBC: 3.11 MIL/uL — ABNORMAL LOW (ref 4.22–5.81)

## 2011-01-08 LAB — GLUCOSE, CAPILLARY: Glucose-Capillary: 167 mg/dL — ABNORMAL HIGH (ref 70–99)

## 2011-01-08 LAB — HEMOGLOBIN A1C
Hgb A1c MFr Bld: 6.8 % — ABNORMAL HIGH (ref <5.7)
Mean Plasma Glucose: 148 mg/dL — ABNORMAL HIGH (ref <117)

## 2011-01-08 LAB — IRON AND TIBC
Iron: 40 ug/dL — ABNORMAL LOW (ref 42–135)
Saturation Ratios: 22 % (ref 20–55)
TIBC: 185 ug/dL — ABNORMAL LOW (ref 215–435)

## 2011-01-09 ENCOUNTER — Inpatient Hospital Stay (HOSPITAL_COMMUNITY)

## 2011-01-09 DIAGNOSIS — I369 Nonrheumatic tricuspid valve disorder, unspecified: Secondary | ICD-10-CM

## 2011-01-09 LAB — CBC
HCT: 29.3 % — ABNORMAL LOW (ref 39.0–52.0)
Hemoglobin: 9.7 g/dL — ABNORMAL LOW (ref 13.0–17.0)
MCHC: 33.1 g/dL (ref 30.0–36.0)
MCV: 96.4 fL (ref 78.0–100.0)
RDW: 19.4 % — ABNORMAL HIGH (ref 11.5–15.5)

## 2011-01-09 LAB — DIFFERENTIAL
Eosinophils Relative: 1 % (ref 0–5)
Lymphocytes Relative: 9 % — ABNORMAL LOW (ref 12–46)
Lymphs Abs: 0.9 10*3/uL (ref 0.7–4.0)
Monocytes Absolute: 1.1 10*3/uL — ABNORMAL HIGH (ref 0.1–1.0)
Monocytes Relative: 11 % (ref 3–12)
Neutro Abs: 7.9 10*3/uL — ABNORMAL HIGH (ref 1.7–7.7)

## 2011-01-09 LAB — GLUCOSE, CAPILLARY
Glucose-Capillary: 166 mg/dL — ABNORMAL HIGH (ref 70–99)
Glucose-Capillary: 200 mg/dL — ABNORMAL HIGH (ref 70–99)
Glucose-Capillary: 158 mg/dL — ABNORMAL HIGH (ref 70–99)

## 2011-01-09 LAB — BASIC METABOLIC PANEL
BUN: 61 mg/dL — ABNORMAL HIGH (ref 6–23)
CO2: 18 mEq/L — ABNORMAL LOW (ref 19–32)
Calcium: 9 mg/dL (ref 8.4–10.5)
GFR calc non Af Amer: 17 mL/min — ABNORMAL LOW (ref >60)
Glucose, Bld: 161 mg/dL — ABNORMAL HIGH (ref 70–99)
Sodium: 133 mEq/L — ABNORMAL LOW (ref 135–145)

## 2011-01-10 ENCOUNTER — Inpatient Hospital Stay (HOSPITAL_COMMUNITY)

## 2011-01-10 LAB — CBC
HCT: 34.4 % — ABNORMAL LOW (ref 39.0–52.0)
Hemoglobin: 11.3 g/dL — ABNORMAL LOW (ref 13.0–17.0)
MCV: 97.5 fL (ref 78.0–100.0)
RBC: 3.53 MIL/uL — ABNORMAL LOW (ref 4.22–5.81)
RDW: 19.8 % — ABNORMAL HIGH (ref 11.5–15.5)
WBC: 8.2 10*3/uL (ref 4.0–10.5)

## 2011-01-10 LAB — DIFFERENTIAL
Lymphocytes Relative: 11 % — ABNORMAL LOW (ref 12–46)
Lymphs Abs: 0.9 10*3/uL (ref 0.7–4.0)
Monocytes Relative: 11 % (ref 3–12)
Neutro Abs: 6.3 10*3/uL (ref 1.7–7.7)
Neutrophils Relative %: 77 % (ref 43–77)

## 2011-01-10 LAB — BASIC METABOLIC PANEL
BUN: 67 mg/dL — ABNORMAL HIGH (ref 6–23)
Chloride: 104 mEq/L (ref 96–112)
GFR calc non Af Amer: 14 mL/min — ABNORMAL LOW (ref >60)
Potassium: 4.7 mEq/L (ref 3.5–5.1)
Sodium: 134 mEq/L — ABNORMAL LOW (ref 135–145)

## 2011-01-10 LAB — GLUCOSE, CAPILLARY
Glucose-Capillary: 178 mg/dL — ABNORMAL HIGH (ref 70–99)
Glucose-Capillary: 161 mg/dL — ABNORMAL HIGH (ref 70–99)
Glucose-Capillary: 173 mg/dL — ABNORMAL HIGH (ref 70–99)

## 2011-01-11 ENCOUNTER — Inpatient Hospital Stay (HOSPITAL_COMMUNITY)

## 2011-01-11 LAB — CBC
Hemoglobin: 10.5 g/dL — ABNORMAL LOW (ref 13.0–17.0)
MCH: 31.7 pg (ref 26.0–34.0)
MCHC: 33.4 g/dL (ref 30.0–36.0)
MCV: 94.9 fL (ref 78.0–100.0)

## 2011-01-11 LAB — BASIC METABOLIC PANEL
Calcium: 9.3 mg/dL (ref 8.4–10.5)
GFR calc non Af Amer: 15 mL/min — ABNORMAL LOW (ref >60)
Glucose, Bld: 158 mg/dL — ABNORMAL HIGH (ref 70–99)
Potassium: 4.4 mEq/L (ref 3.5–5.1)
Sodium: 137 mEq/L (ref 135–145)

## 2011-01-11 LAB — DIFFERENTIAL
Basophils Relative: 0 % (ref 0–1)
Lymphocytes Relative: 5 % — ABNORMAL LOW (ref 12–46)
Lymphs Abs: 0.5 10*3/uL — ABNORMAL LOW (ref 0.7–4.0)
Monocytes Absolute: 0.9 10*3/uL (ref 0.1–1.0)
Monocytes Relative: 9 % (ref 3–12)
Neutrophils Relative %: 84 % — ABNORMAL HIGH (ref 43–77)

## 2011-01-11 LAB — GLUCOSE, CAPILLARY: Glucose-Capillary: 147 mg/dL — ABNORMAL HIGH (ref 70–99)

## 2011-01-11 LAB — HEPATITIS B SURFACE ANTIGEN: Hepatitis B Surface Ag: NEGATIVE

## 2011-01-11 LAB — HEPATITIS B SURFACE ANTIBODY,QUALITATIVE: Hep B S Ab: POSITIVE — AB

## 2011-01-11 NOTE — Consult Note (Signed)
[name] [name], [name] [name]                ACCOUNT NO.:  [id number]  MEDICAL RECORD NO.:  [id number]           PATIENT TYPE:  LOCATION:                                 FACILITY:  PHYSICIAN:  Tilford Pillar, MD      DATE OF BIRTH:  09-13-1924  DATE OF CONSULTATION:  01/04/2011 DATE OF DISCHARGE:                                CONSULTATION   REASON FOR CONSULTATION:  Right fourth and fifth toes dry gangrene.  HISTORY OF PRESENT ILLNESS:  The patient is an 77 year old male with a history of atherosclerotic vascular disease, hypertension, diabetes mellitus type 2, and previous left below-the-knee amputation who presented to [facility] Emergency Department with continued issues with his right foot.  The patient had actually been evaluated and seen by me in the past for evaluation of the dry gangrene.  At that point, the patient did not wish to proceed with an amputation and referral was made to Vascular Surgery at [facility].  The patient did undergo a workup and evaluation and revascularization was not a possibility.  The patient is unclear as to why he did not opt at that time to proceed with an amputation but he does now present again to [facility] for evaluation of the right foot.  He has had no fevers and chills, no significant pain, no nausea and vomiting.  He is ambulatory with some assistance and occasional difficulties.  PAST MEDICAL HISTORY: 1. Diabetes mellitus, type 2. 2. Hypertension. 3. Peripheral vascular disease. 4. Atherosclerotic vascular disease. 5. Osteomyelitis of the fourth and fifth right toes. 6. Atrial flutter.  PAST SURGICAL HISTORY: 1. Multiple left lower extremity revascularization procedures, unclear     as to which procedures the patient has had due to the patient's     confusion. 2. He has had also a left below-the-knee amputation.  MEDICATIONS:  Percocet, furosemide, metoprolol, simvastatin, lisinopril, spironolactone, cilostazol, aspirin,  clindamycin, insulin although the patient is unsure of this.  ALLERGIES:  No known drug allergies.  SOCIAL HISTORY:  No current alcohol or tobacco use.  He has had previous tobacco use but stopped smoking approximately 20 years ago.  No recreational drug abuse.  FAMILY HISTORY:  Noncontributory.  PHYSICAL EXAMINATION:  VITAL SIGNS:  Temperature 97.4, heart rate 115, respirations 16, blood pressure 111/82.  He has 98% O2 saturation on room air. GENERAL:  He is in a seated position in his hospital bed.  He is awake. He is alert although occasionally confused to location.  He is able to explain what interventions he has been through to date.  He is calm. HEENT:  Scalp:  No deformities or masses.  Eyes:  Pupils equal, round, and reactive.  Extraocular movements are intact.  No conjunctival pallor or scleral icterus is noted.  Oral mucosa is pink.  Poor dentition. NECK:  Trachea is midline.  No cervical lymphadenopathy. PULMONARY:  He has unlabored respiration.  He is clear to auscultation bilaterally. CARDIOVASCULAR:  He has tachycardia but regular rhythm.  He has 2+ radial pulses.  He has bilateral 2+ femoral pulses and popliteal pulses. He has diminished dorsalis  pedis and posterior tibialis pulses on the right side.  He has as above mentioned left below-the-knee amputation. ABDOMEN:  Soft, nontender. SKIN:  Right lower extremity demonstrates dry gangrene of the right fifth toe.  There is thickening of the skin of the distal foot, limited blood flow.  There is no pain.  There is no crepitance.  There is no evidence of any erythema.  ASSESSMENT AND PLAN:  Dry gangrene of right fourth and fifth toes, atherosclerotic vascular disease.  Plan at this time was discussed with the patient as his previous angiogram at [facility] demonstrated limited distal blood flow. Unfortunately, the patient has limited options.  I did discuss with the patient proceeding with a below-the-knee  amputation although he would be a bilateral amputee.  He will still have bilateral knee joint and still have improved functionality versus above-the-knee amputation.  At this time, there is no evidence of any wet gangrene at this point and recommend a below-the-knee amputation of the right side.  Risks, benefits, and alternatives were discussed with the patient.  His questions and concerns were addressed and at this point, we will plan to proceed with below-the-knee amputation of the right lower extremity tomorrow as discussed with the patient.     Tilford Pillar, MD     BZ/MEDQ  D:  01/04/2011  T:  01/04/2011  Job:  [id number]  Electronically Signed by Tilford Pillar MD on 01/11/2011 10:47:58 AM

## 2011-01-11 NOTE — Group Therapy Note (Signed)
NAME:  [name] [name], [name] [name]                ACCOUNT NO.:  [id number]  MEDICAL RECORD NO.:  [id number]           PATIENT TYPE:  I  LOCATION:  A328                          FACILITY:  [facility]  PHYSICIAN:  [name] [name], M.D.    DATE OF BIRTH:  [birth year]  DATE OF PROCEDURE: DATE OF DISCHARGE:                                PROGRESS NOTE   CURRENT DIAGNOSES AND PROBLEM LIST: 1. Postoperative day #5 for right below-knee amputation secondary to     gangrene and osteomyelitis. 2. Acute on progressive stage IV chronic kidney disease.  The patient     is now oliguric.  Hemodialysis is planned today. 3. Severe systolic dysfunction and acute congestive heart failure.     The patient's ejection fraction is 15-20% per 2-D echocardiogram on     Jan 09, 2011. 4. Anasarca with bilateral pleural effusions and severe scrotal edema     secondary to both progressive chronic kidney disease and systolic     dysfunction/congestive heart failure. 5. Chronic atrial flutter.  The patient was deemed not to be a     Coumadin candidate. 6. Urinary retention, associated with severe scrotal edema. 7. Hypertension with a trend toward low normal blood pressures. 8. Type 2 diabetes mellitus.  The patient's hemoglobin A1c is 6.8. 9. Metabolic acidosis and hyperphosphatemia secondary to progressive     chronic kidney disease. 10.Normocytic anemia, secondary to perioperative blood loss and     progressive chronic kidney disease.  His total iron was 40, TIBC     185, percent saturation 22, vitamin B12 greater than 2000, folate     10.5, and ferritin 490. 11.History of previous cerebrovascular accident with left-sided     weakness. 12.History of left below-the-knee amputation in the past. 13.Mild dementia.  CONSULTATIONS: 1. [name] Pillar, MD 2. [name] Loa, MD 3. [name] Barban, MD  PROCEDURE PERFORMED: 1. Femoral dialysis catheter and hemodialysis starting today. 2. Chest x-ray on Jan 09, 2011.  The results  revealed new bilateral     pleural effusions with lower lobe collapse or consolidation and     stable cardiomegaly. 3. Renal ultrasound on Jan 07, 2011.  The results revealed no evidence     for hydronephrosis.  Increased renal cortical echogenicity     suggesting medical renal disease, bilateral renal cysts, bilateral     pleural effusions with small volume ascites. 4. Two-D echocardiogram on Jan 09, 2011.  The results revealed left     ventricular cavity size was normal.  Wall thickness was increased     in the pattern of mild to moderate left ventricular hypertrophy.     Systolic function was severely reduced.  Ejection fraction     estimated to be 15-20%.  Severe diffuse hypokinesis with apical     akinesis.  Ventricular septal motion showed moderate paradox.     Tricuspid valve with moderate regurgitation.  There was a moderate-     sized left pleural effusion.  Right atrium moderately dilated.     Right ventricular systolic function was severely reduced.  SUBJECTIVE:  The patient is sitting up in bed.  He has no complaints of chest pain or shortness of breath.  He does acknowledge that his scrotum is uncomfortable.  The nursing staff reports a decrease in his urine output last night.  He only urinated approximately 150 mL overnight.  OBJECTIVE:  VITAL SIGNS:  Temperature 97.4, pulse 73, respiratory rate 16, oxygen saturation 97% on 3 liters, and blood pressure 92/56. Capillary blood glucose 161.  Total in 1355 and total out 175. GENERAL:  The patient is [name], sitting up in bed, in no acute distress. LUNGS:  Breathing is nonlabored.  Decreased breath sounds in the bases. Clear anteriorly. HEART:  S1 and S2 with ectopic beats versus irregularly irregular. ABDOMEN:  Moderately edematous with ascites.  His abdomen is tight. Nontender.  Bowel sounds are present.  Moderate to severe scrotal edema.  Mildly tender upon palpation.  There is an  indwelling Foley catheter in the urethra.  It is draining yellow urine. EXTREMITIES:  2+ left stump edema.  The right stump has the dressing in place which was not taken off.  There appears to be generalized edema. NEUROLOGIC:  The patient is alert and oriented to [facility] (he does not know the name) and himself.  His speech is clear.  He does follow directions.  At times, he is confused.  LABORATORY STUDIES:  WBC 8.2, hemoglobin 11.3, and platelet count 280. Sodium 134, potassium 4.7, chloride 104, CO2 of 15, glucose 166, BUN 67, creatinine 4.13, calcium 9.4, and phosphorus 5.5.  ASSESSMENT: 1. Acute oliguric renal failure, superimposed on progressive chronic     kidney disease.  The patient has associated hyperphosphatemia and     metabolic acidosis.  In my conversation with Dr. Kristian [name] today, we     both agreed that the patient needs dialysis.  Dr. Kristian [name] has     discussed the need for dialysis with the patient and the patient's     wife.  They are both in agreement with proceeding with     hemodialysis.  His ultrasound reveals medical renal disease. 2. Newly diagnosed left ventricular systolic dysfunction and     congestive heart failure.  The 2-D echocardiogram was ordered     yesterday to assess the patient's anasarca.  His ejection fraction     was noted to be 15-20%.  He had no prior history of congestive     heart failure according to old records, but he does have peripheral     vascular disease well documented.  His congestive heart failure is     manifested by bilateral pleural effusions, ascites, and     scrotal edema.  Lasix was increased to 200 mg IV every 12 hours     yesterday by Dr. Kristian [name], however, the patient's urine output did     not improve.  ACE inhibitor therapy is contraindicated currently     because of his progressive renal failure.  He is being treated with     a beta-blocker, however. 3. Scrotal edema and urinary retention.  The patient was  unable to     urinate several days ago and therefore a Foley catheter was     inserted.  Following the insertion, it did drain urine.  His renal     ultrasound revealed no hydronephrosis.  Urologist, Dr. Jerre Simon was     consulted.  He evaluated the patient yesterday.  He believes that     the patient's urinary retention is secondary to scrotal edema.  He  agreed with attempted diuresis to decrease the swelling.  He did     not recommend any further treatment at this time.  He did, however,     agree with elevating the patient's scrotum. 4. Atrial fibrillation/atrial flutter.  The patient's heart rate was     elevated several days ago, however, over the past 48-72 hours, his     heart rate has been well controlled.  Metoprolol was apparently     increased from 25 mg b.i.d. to 50 mg b.i.d.  Also, Cardizem was     added last week for better rate control.  Due to low normal blood     pressures, the metoprolol will be titrated back down again and the     Cardizem will be discontinued. 5. Hypertension with low normal blood pressures.  Over the past 2     days, the patient's systolic blood pressure has been trending     downward to the 90s.  For this reason, his antihypertensive     medication doses will be adjusted. 6. Type 2 diabetes mellitus.  For the most part, his capillary blood     glucose has been less than 200.  His hemoglobin A1c is 6.8.  He is     being treated with sliding scale NovoLog only. 7. Normocytic anemia.  The patient's hemoglobin has been generally     ranging from 9.5-11.0.  It is 11.3 today.  An anemia panel was     ordered.  The results were dictated above.  The patient has a     combination of anemia of chronic kidney disease and anemia from     perioperative blood loss. 8. Status post right below-knee amputation.  The patient underwent     right BKA 5 days ago by Dr. Tilford [name].  He is managing the     wound care and dressing changes.  Per his assessment, the      postoperative site looks good.  The patient was initially treated     with antibiotics, however, following the operation, antibiotics     were discontinued.  The patient has remained afebrile.  His white     blood cell count is within normal limits. 9. Mild dementia.  The patient has been pleasantly confused at times.     Other times, he is oriented and appropriate.  He had been followed     by [facility].  Apparently, he was     receiving hospice services when he refused a right BKA for gangrene     and osteomyelitis.  The patient remains a full code.  It is unclear     whether not he is still appropriate for hospice care.  In light of     his severe cardiomyopathy recently diagnosed, he may need to be     followed by Hospice indefinitely.  PLAN: 1. The patient is going to be dialyzed today once the femoral catheter     is inserted. 2. We will discontinue Cardizem.  We will decrease the dose of     metoprolol to 25 mg b.i.d. due to his low normal blood pressures     and the start of hemodialysis. 3. It is likely that the patient will need to be placed for wound care     and physical therapy.     [name] [name], M.D.     DF/MEDQ  D:  01/10/2011  T:  01/10/2011  Job:  [id number]  cc:  Dr. Adria Dill, MD  [name] [name], M.D. Fax: [phone]  [name] Pillar, MD Fax: [phone]  Electronically Signed by [name] [name] M.D. on 01/11/2011 02:50:10 PM

## 2011-01-12 LAB — HEPATITIS B CORE ANTIBODY, TOTAL: Hep B Core Total Ab: POSITIVE — AB

## 2011-01-12 LAB — BASIC METABOLIC PANEL
CO2: 26 mEq/L (ref 19–32)
Chloride: 100 mEq/L (ref 96–112)
GFR calc Af Amer: 21 mL/min — ABNORMAL LOW (ref >60)
Glucose, Bld: 138 mg/dL — ABNORMAL HIGH (ref 70–99)
Potassium: 4 mEq/L (ref 3.5–5.1)
Sodium: 136 mEq/L (ref 135–145)

## 2011-01-12 LAB — DIFFERENTIAL
Basophils Absolute: 0 10*3/uL (ref 0.0–0.1)
Basophils Relative: 0 % (ref 0–1)
Lymphocytes Relative: 7 % — ABNORMAL LOW (ref 12–46)
Monocytes Absolute: 0.8 10*3/uL (ref 0.1–1.0)
Neutro Abs: 8.1 10*3/uL — ABNORMAL HIGH (ref 1.7–7.7)
Neutrophils Relative %: 82 % — ABNORMAL HIGH (ref 43–77)

## 2011-01-12 LAB — GLUCOSE, CAPILLARY
Glucose-Capillary: 193 mg/dL — ABNORMAL HIGH (ref 70–99)
Glucose-Capillary: 162 mg/dL — ABNORMAL HIGH (ref 70–99)

## 2011-01-12 LAB — CBC
HCT: 30.5 % — ABNORMAL LOW (ref 39.0–52.0)
Hemoglobin: 10.2 g/dL — ABNORMAL LOW (ref 13.0–17.0)
RBC: 3.2 MIL/uL — ABNORMAL LOW (ref 4.22–5.81)
WBC: 9.8 10*3/uL (ref 4.0–10.5)

## 2011-01-12 NOTE — Group Therapy Note (Signed)
NAME:  [name] [name], [name] [name]                ACCOUNT NO.:  [id number]  MEDICAL RECORD NO.:  [id number]           PATIENT TYPE:  I  LOCATION:  A328                          FACILITY:  [facility]  PHYSICIAN:  Elliot Cousin, M.D.    DATE OF BIRTH:  02-10-1925  DATE OF PROCEDURE:  01/11/2011 DATE OF DISCHARGE:                                PROGRESS NOTE   SUBJECTIVE:  The patient just returned to his room from dialysis.  He has no current complaints of chest pain or shortness of breath.  He is starting to eat his dinner.  He says his appetite is good.  OBJECTIVE:  VITAL SIGNS:  Temperature 98, pulse 119, respiratory rate 18, blood pressure 112/67, and oxygen saturation 98% on 2 L.  Urine currently in the Foley bag is approximately 800 mL.  His total intake over 24 hours from May 15 to May 16 was approximately 660 mL and total output was approximately 3100 mL including hemodialysis.  His CBG is 196. GENERAL:  The patient is sitting up in bed, getting ready to eat.  He isin no acute distress. LUNGS:  Improving air movement down to the bases.  Clear anteriorly.  No crackles or wheezes auscultated.  His breathing is nonlabored. HEART:  S1-S2 with ectopy versus irregularly irregular and tachycardia. ABDOMEN:  Significantly less tight.  There is less ascites.  Bowel sounds are present.  Nontender. GU:  There is still a moderate amount of scrotal and penile swelling but markedly less than yesterday.  There is approximately 800 mL of yellow urine in his Foley bag. EXTREMITIES:  Approximately 1+ edema of his right-left stump and the right stump continues to have the bandage on which was not removed. NEUROLOGIC:  The patient is alert and oriented to himself and [facility].  In general, he is significantly less edematous.  LABORATORY DATA:  WBC 10.0, hemoglobin 10.5, and platelet count 266. Sodium 137, potassium 4.4, chloride 102, CO2 22, glucose 158, BUN 55, creatinine 3.86, and calcium  9.3.  ASSESSMENT: 1. Acute renal failure, superimposed on progressive chronic kidney     disease.  The patient also has associated hyperphosphatemia and     metabolic acidosis.  A femoral dialysis catheter was placed     yesterday.  He received his first session of dialysis yesterday.     He received a second session today.  Both his BUN and creatinine     have improved from yesterday but certainly not within normal     limits.  His acidosis has resolved.  He is clearly less edematous.     Surprisingly, his urine output actually increased over the past 24     hours. 2. Newly diagnosed congestive heart failure with exacerbation.  He     remains on intravenous Lasix at 200 mg IV b.i.d. as ordered by Dr.     Kristian Covey.  He is on a beta blocker.  ACE inhibitor therapy has not     been started due to his progressive kidney disease.  As stated     above, he is less anasarcic owing to the success of  hemodialysis. 3. Scrotal edema and urinary retention.  The indwelling Foley catheter     remains.  As his scrotal edema subsides and hopefully resolve, a     voiding trial can be tried.  There was no evidence of     hydronephrosis on the renal ultrasound.  For the most part, Dr.     Jerre Simon has signed off. 4. Atrial fibrillation/atrial flutter.  The Cardizem was discontinued     yesterday for relative hypotension.  The dose of metoprolol was     decreased from 50 mg b.i.d. to 25 mg b.i.d.  Over the course of     today, his heart rate has picked up.  It is now in the lower teens.     I do not believe he received metoprolol so far today due to     dialysis. 5. Hypertension with low normal blood pressures.  The patient's     systolic blood pressure has improved some to the 110-120 range. 6. Type 2 diabetes mellitus.  The patient's capillary blood glucose     has been generally below 200.  His hemoglobin A1c is 6.8. 7. Normocytic anemia.  The patient's hemoglobin has been more or less     stable.  His  anemia is from a combination of chronic kidney disease     and anemia from perioperative blood loss. 8. Status post right below-the-knee amputation.  The patient underwent     right BKA 6 days ago by Dr. Tilford Pillar.  He is managing the     wound care and dressing changes.  PLAN: 1. We will restart Cardizem at 30 mg 3 times daily.  We will maintain     metoprolol at 25 mg b.i.d.  If his blood pressures remain on the     low normal side and titrating the metoprolol and Cardizem will     compromise his blood pressure more, we will consider digoxin. 2. The patient will continue to be dialyzed by Dr. Kristian Covey.  The     length of therapy will be at his discretion. 3. The anticipated date of discharge is unknown.  It is possible that     he could be discharged to a skilled nursing facility of choice by     the end of the week.  The date of discharge should be discussed     with the specialists involved in his care including Dr. Leticia Penna      and Dr. Kristian Covey.     Elliot Cousin, M.D.     DF/MEDQ  D:  01/11/2011  T:  01/11/2011  Job:  [id number]  Electronically Signed by Elliot Cousin M.D. on 01/12/2011 10:39:40 PM

## 2011-01-12 NOTE — Discharge Summary (Signed)
  NAME:  [name] [name], [name] [name]                ACCOUNT NO.:  [id number]  MEDICAL RECORD NO.:  [id number]           PATIENT TYPE:  I  LOCATION:  A328                          FACILITY:  [facility]  PHYSICIAN:  Ky Barban, M.D.DATE OF BIRTH:  [birth year]  DATE OF ADMISSION:  01/03/2011 DATE OF DISCHARGE:  LH                         DISCHARGE SUMMARY-REFERRING   CHIEF COMPLAINT:  Scrotal edema.  HISTORY:  A 77 year old gentleman has multiple medical problem.  He has undergone right BK amputation for peripheral vascular disease and he is also found to have renal failure.  His creatinine exam is 3.3 and nephrologist is following him.  I was called in to see him because he has marked swelling of his penis and the scrotum.  He has a Foley catheter, which was draining clear urine and has no urological complaints.  PHYSICAL EXAMINATION:  GENERAL:  On examination moderately obese, not in acute distress. VITAL SIGNS:  Blood pressure 150/80, temperature is normal.ABDOMEN:  Soft, flat.  Liver, spleen, and kidneys not palpable. EXTREMITIES:      There is marked edema in the extremities while going to the lower abdominal wall and descending into the scrotum and the penis, which is markedly swollen. RECTAL:  Deferred.  LABORATORY DATA:  His sodium is 133, potassium 4.1, chloride 102, CO2 is 18, glucose 161, BUN is 61, creatinine 3.53.  These values are from May 14, and calcium was 9.  WBC count is 9.9, hematocrit is 29.3.  His phosphorus is 5.5 slightly high.  A renal ultrasound shows no hydronephrosis.  IMPRESSION AND PLAN:  Anasarca, scrotal and penile edema is part of the generalized edema, and it is because of the renal failure that he is not able to get all the fluid out and nephrologist is looking after him. There is no acute urological problem.  He need continuous diuresis.  If there is any other questions after this, please let me know.     Ky Barban, M.D.     MIJ/MEDQ  D:   01/09/2011  T:  01/09/2011  Job:  [id number]  Electronically Signed by Alleen Borne M.D. on 01/12/2011 04:58:25 PM

## 2011-01-13 LAB — GLUCOSE, CAPILLARY
Glucose-Capillary: 101 mg/dL — ABNORMAL HIGH (ref 70–99)
Glucose-Capillary: 217 mg/dL — ABNORMAL HIGH (ref 70–99)

## 2011-01-13 LAB — CBC
MCH: 31.6 pg (ref 26.0–34.0)
MCHC: 33.2 g/dL (ref 30.0–36.0)
MCV: 95.1 fL (ref 78.0–100.0)
Platelets: 239 10*3/uL (ref 150–400)
RDW: 18.9 % — ABNORMAL HIGH (ref 11.5–15.5)

## 2011-01-13 LAB — BASIC METABOLIC PANEL
CO2: 23 mEq/L (ref 19–32)
Calcium: 9.2 mg/dL (ref 8.4–10.5)
Creatinine, Ser: 3.84 mg/dL — ABNORMAL HIGH (ref 0.4–1.5)
GFR calc Af Amer: 18 mL/min — ABNORMAL LOW (ref >60)
GFR calc non Af Amer: 15 mL/min — ABNORMAL LOW (ref >60)
Sodium: 135 mEq/L (ref 135–145)

## 2011-01-13 LAB — DIFFERENTIAL
Basophils Relative: 0 % (ref 0–1)
Eosinophils Absolute: 0.2 10*3/uL (ref 0.0–0.7)
Eosinophils Relative: 3 % (ref 0–5)
Lymphs Abs: 0.6 10*3/uL — ABNORMAL LOW (ref 0.7–4.0)
Monocytes Relative: 9 % (ref 3–12)

## 2011-01-14 ENCOUNTER — Inpatient Hospital Stay (HOSPITAL_COMMUNITY)

## 2011-01-14 LAB — GLUCOSE, CAPILLARY
Glucose-Capillary: 126 mg/dL — ABNORMAL HIGH (ref 70–99)
Glucose-Capillary: 388 mg/dL — ABNORMAL HIGH (ref 70–99)

## 2011-01-14 LAB — DIFFERENTIAL
Eosinophils Absolute: 0.2 10*3/uL (ref 0.0–0.7)
Lymphs Abs: 1 10*3/uL (ref 0.7–4.0)
Neutro Abs: 5.5 10*3/uL (ref 1.7–7.7)
Neutrophils Relative %: 73 % (ref 43–77)

## 2011-01-14 LAB — BASIC METABOLIC PANEL
BUN: 54 mg/dL — ABNORMAL HIGH (ref 6–23)
GFR calc Af Amer: 16 mL/min — ABNORMAL LOW (ref >60)
GFR calc non Af Amer: 13 mL/min — ABNORMAL LOW (ref >60)
Potassium: 3.7 mEq/L (ref 3.5–5.1)
Sodium: 135 mEq/L (ref 135–145)

## 2011-01-14 LAB — CBC
Hemoglobin: 11.2 g/dL — ABNORMAL LOW (ref 13.0–17.0)
MCV: 96 fL (ref 78.0–100.0)
Platelets: 216 10*3/uL (ref 150–400)
RBC: 3.53 MIL/uL — ABNORMAL LOW (ref 4.22–5.81)
WBC: 7.5 10*3/uL (ref 4.0–10.5)

## 2011-01-14 NOTE — Op Note (Signed)
  [name], [name] [name]                ACCOUNT NO.:  [id number]  MEDICAL RECORD NO.:  [id number]           PATIENT TYPE:  I  LOCATION:  A328                          FACILITY:  [facility]  PHYSICIAN:  Tilford Pillar, MD      DATE OF BIRTH:  20-Aug-1925  DATE OF PROCEDURE:  01/10/2011 DATE OF DISCHARGE:                              OPERATIVE REPORT   PREPROCEDURE DIAGNOSIS:  Acute renal failure.  POSTPROCEDURE DIAGNOSIS:  Acute renal failure.  PROCEDURE:  Left femoral vein with central venous catheter and hemodialysis catheter placement.  SURGEON:  Tilford Pillar, MD  ANESTHESIA:  Lidocaine 1% plain.  ESTIMATED BLOOD LOSS:  Minimal.  INDICATIONS:  The patient is well known to me after reaching right below- the-knee amputation for dry gangrene, the right fourth and fifth toe. He has a significant history of atherosclerotic vascular disease and actually had a previous left below-the-knee amputation.  His postoperative course was complicated by worsening renal function. Although, he continued to have a well maturing __________.  Renal function has been monitored and managed by Dr. Kristian Covey, and at this point there are plans__________ hemodialysis catheter is required. Risks, benefits, and alternatives were discussed with the patient and his family.  A consent was obtained.  PROCEDURE IN DETAIL:  The patient left groin was prepped with chlorhexidine solution and draped in standard fashion due to his recent right below-knee amputation.  Unfortunately, due to the patient's significant vascular history and history of bypass on this side, good pulsatile flow was not identified and therefore anesthetized __________to a left femoral incision scar carefully aspirating __________to make sure there was no incision into a vein or artery.  At this point, an 18-gauge introducer needle was used to identify the left femoral vein.  Good venous return was obtained.  A  J-wire was advanced __________.  A  hemodialysis catheter was advanced __________hub.  J-wire was removed.  Good venous return was obtained from both ports and the catheter was flushed.  At this time, the catheter was secured to the skin with a 2-0 silk x2 for redundancy. The area was rinsed and dried with saline moistened gauze and dry gauze sponge, and then a and sterile dressing was placed.  The drapes were removed.  The __________.  The patient tolerated the procedure extremely well.     Tilford Pillar, MD     BZ/MEDQ  D:  01/10/2011  T:  01/11/2011  Job:  [id number]  Electronically Signed by Tilford Pillar MD on 01/14/2011 08:35:46 PM

## 2011-01-15 LAB — DIFFERENTIAL
Basophils Relative: 0 % (ref 0–1)
Eosinophils Absolute: 0.1 10*3/uL (ref 0.0–0.7)
Monocytes Absolute: 0.6 10*3/uL (ref 0.1–1.0)
Monocytes Relative: 10 % (ref 3–12)
Neutrophils Relative %: 74 % (ref 43–77)

## 2011-01-15 LAB — CBC
MCH: 31.1 pg (ref 26.0–34.0)
MCHC: 32.8 g/dL (ref 30.0–36.0)
Platelets: 193 10*3/uL (ref 150–400)

## 2011-01-15 LAB — BASIC METABOLIC PANEL
Calcium: 9 mg/dL (ref 8.4–10.5)
Creatinine, Ser: 3.76 mg/dL — ABNORMAL HIGH (ref 0.4–1.5)
GFR calc Af Amer: 19 mL/min — ABNORMAL LOW (ref >60)
GFR calc non Af Amer: 15 mL/min — ABNORMAL LOW (ref >60)

## 2011-01-15 LAB — GLUCOSE, CAPILLARY
Glucose-Capillary: 194 mg/dL — ABNORMAL HIGH (ref 70–99)
Glucose-Capillary: 163 mg/dL — ABNORMAL HIGH (ref 70–99)

## 2011-01-16 LAB — GLUCOSE, CAPILLARY
Glucose-Capillary: 233 mg/dL — ABNORMAL HIGH (ref 70–99)
Glucose-Capillary: 199 mg/dL — ABNORMAL HIGH (ref 70–99)

## 2011-01-16 LAB — BASIC METABOLIC PANEL
BUN: 50 mg/dL — ABNORMAL HIGH (ref 6–23)
Calcium: 8.9 mg/dL (ref 8.4–10.5)
GFR calc non Af Amer: 13 mL/min — ABNORMAL LOW (ref >60)
Glucose, Bld: 165 mg/dL — ABNORMAL HIGH (ref 70–99)

## 2011-01-16 LAB — CBC
MCV: 94.9 fL (ref 78.0–100.0)
Platelets: 171 10*3/uL (ref 150–400)
RDW: 18.2 % — ABNORMAL HIGH (ref 11.5–15.5)
WBC: 5.4 10*3/uL (ref 4.0–10.5)

## 2011-01-16 LAB — DIFFERENTIAL
Eosinophils Absolute: 0.2 10*3/uL (ref 0.0–0.7)
Eosinophils Relative: 3 % (ref 0–5)
Lymphs Abs: 0.8 10*3/uL (ref 0.7–4.0)

## 2011-01-16 NOTE — Consult Note (Signed)
NAME:  [name] [name], [name] [name]                ACCOUNT NO.:  [id number]  MEDICAL RECORD NO.:  [id number]           PATIENT TYPE:  LOCATION:                                 FACILITY:  PHYSICIAN:  Jorja Loa, M.D.DATE OF BIRTH:  09/29/1924  DATE OF CONSULTATION: DATE OF DISCHARGE:                                CONSULTATION   REASON FOR CONSULT:  Elevated BUN and creatinine.  [name] [name] is an 77 year old gentleman with past medical history of hypertension, history of diabetes and left BKA, presently was brought to the hospital because of the gangrene of the right foot considering surgery.  According to the patient, he had some pain of the leg.  The patient has been initially at [facility], where he had a BKA and initially treated and evaluated, sent home to be followed as an outpatient.  At this moment as stated above, the patient was brought because of infection and gangrene of the right foot.  Since his BUN and creatinine has been increasing, consult is called.  Presently, the patient denies any nausea or vomiting.  PAST MEDICAL HISTORY: 1. He has history of coronary artery disease. 2. He has history of CVA with left-sided weakness. 3. He has history of diabetes. 4. He has history of left BKA. 5. History of atrial fibrillation. 6. History of hyperlipidemia. 7. History of chronic renal failure. 8. History of transaminitis at one moment. 9. History of generalized arteriosclerotic disease.  SURGERY:  He has left BKA.  MEDICATIONS:  His medications consist of: 1. Aspirin 81 mg p.o. daily. 2. Cardizem 30 mg p.o. q.6 h. 3. Lovenox 30 mg subcu. 4. Lasix 20 mg IV q.12 h. 5. He is getting lactated Ringer at 120 mL/hour. 6. Metoprolol 50 mg p.o. b.i.d. 7. Crestor 10 mg p.o. daily. 8. He is also on normal saline which was given one time at 100     mL/hour. 9. Other medication seems to be p.r.n. medication.  ALLERGIES:  No known allergies.  SOCIAL HISTORY:  The patient  presently has no history of alcohol or tobacco use.  He is an ex-smoker.  He was brought from [facility].  REVIEW OF SYSTEMS:  The patient denies any nausea, no vomiting. Appetite is good.  He does not have any complaints at this moment.  He denies any chest pain.  He said he has a little bit of pain on his leg. He denies any diarrhea.  No urgency or frequency.  PHYSICAL EXAMINATION:  VITAL SIGNS:  His temperature is 97.5, blood pressure is 92/56 and pulse of 81. CHEST:  Clear to auscultation. HEART:  Regular rate and rhythm.  No murmur.  No S3. ABDOMEN:  Soft.  Positive bowel sounds. EXTREMITIES:  He does not have any edema.  He has BKA.  LABORATORY DATA:  His white blood cell count is 10.5, hemoglobin 9.7, hematocrit 29, and platelets of 29.  Sodium, this is from yesterday, 141, potassium is 3.9, BUN is 62, and creatinine is 2.75 with a GFR of 27 mL per minute.  ASSESSMENT: 1. Renal failure at this moment, probably chronic.  He has an     ultrasound which was done on October 26, 2010, right kidney seems     to be 10.6, left kidney 10.2.  Presently, he does not have any     uremic signs and symptoms. 2. History of low CO2, probably metabolic acidosis. 3. History of hypertension.  Blood pressure seems to be somewhat to     the low side. 4. History of diabetes.  He is on no antibiotics. 5. History of peripheral vascular disease, status post below-knee     amputation. 6. History of  atrial fibrillation. 7. History of hypercholesterolemia. 8. History of elevated liver function test from before, but the last     time also in Jan 05, 2011, his alk phosphate is 374 and SGPT is 26.  RECOMMENDATIONS:  I agree with present management.  Probably, we will just change his IV fluids to normal saline.  The patient seems to be nonoliguric.  We will follow his BMET.  We will check his phosphorus. Since he has some anemia, we will check also iron studies.  We  will continue his other medications.     Jorja Loa, M.D.     BB/MEDQ  D:  01/07/2011  T:  01/08/2011  Job:  [id number]  Electronically Signed by Jorja Loa M.D. on 01/16/2011 01:43:03 PM

## 2011-01-17 ENCOUNTER — Ambulatory Visit (HOSPITAL_COMMUNITY)
Admit: 2011-01-17 | Discharge: 2011-01-17 | Disposition: A | Discharge status: Home / Self Care | Attending: Nephrology | Admitting: Nephrology

## 2011-01-17 ENCOUNTER — Other Ambulatory Visit (HOSPITAL_COMMUNITY): Payer: Medicare Other

## 2011-01-17 ENCOUNTER — Ambulatory Visit (HOSPITAL_COMMUNITY): Payer: Medicare Other

## 2011-01-17 LAB — DIFFERENTIAL
Basophils Absolute: 0 10*3/uL (ref 0.0–0.1)
Lymphocytes Relative: 12 % (ref 12–46)
Lymphs Abs: 0.7 10*3/uL (ref 0.7–4.0)
Neutro Abs: 4.1 10*3/uL (ref 1.7–7.7)
Neutrophils Relative %: 71 % (ref 43–77)

## 2011-01-17 LAB — BASIC METABOLIC PANEL
Calcium: 9.2 mg/dL (ref 8.4–10.5)
GFR calc Af Amer: 14 mL/min — ABNORMAL LOW (ref >60)
GFR calc non Af Amer: 11 mL/min — ABNORMAL LOW (ref >60)
Potassium: 3.6 mEq/L (ref 3.5–5.1)
Sodium: 135 mEq/L (ref 135–145)

## 2011-01-17 LAB — PROTIME-INR
INR: 1.3 (ref 0.00–1.49)
Prothrombin Time: 16.4 seconds — ABNORMAL HIGH (ref 11.6–15.2)

## 2011-01-17 LAB — CBC
HCT: 30.2 % — ABNORMAL LOW (ref 39.0–52.0)
MCV: 95.3 fL (ref 78.0–100.0)
RBC: 3.17 MIL/uL — ABNORMAL LOW (ref 4.22–5.81)
RDW: 17.8 % — ABNORMAL HIGH (ref 11.5–15.5)
WBC: 5.8 10*3/uL (ref 4.0–10.5)

## 2011-01-17 LAB — SURGICAL PCR SCREEN: MRSA, PCR: NEGATIVE

## 2011-01-17 LAB — APTT: aPTT: 41 seconds — ABNORMAL HIGH (ref 24–37)

## 2011-01-18 ENCOUNTER — Inpatient Hospital Stay (HOSPITAL_COMMUNITY)

## 2011-01-18 LAB — DIFFERENTIAL
Basophils Absolute: 0 10*3/uL (ref 0.0–0.1)
Basophils Relative: 0 % (ref 0–1)
Eosinophils Absolute: 0.2 10*3/uL (ref 0.0–0.7)
Monocytes Absolute: 0.7 10*3/uL (ref 0.1–1.0)
Monocytes Relative: 11 % (ref 3–12)
Neutro Abs: 4.2 10*3/uL (ref 1.7–7.7)
Neutrophils Relative %: 71 % (ref 43–77)

## 2011-01-18 LAB — GLUCOSE, CAPILLARY
Glucose-Capillary: 108 mg/dL — ABNORMAL HIGH (ref 70–99)
Glucose-Capillary: 161 mg/dL — ABNORMAL HIGH (ref 70–99)
Glucose-Capillary: 181 mg/dL — ABNORMAL HIGH (ref 70–99)
Glucose-Capillary: 267 mg/dL — ABNORMAL HIGH (ref 70–99)

## 2011-01-18 LAB — BASIC METABOLIC PANEL
BUN: 58 mg/dL — ABNORMAL HIGH (ref 6–23)
CO2: 25 mEq/L (ref 19–32)
Calcium: 9.4 mg/dL (ref 8.4–10.5)
Chloride: 99 mEq/L (ref 96–112)
Creatinine, Ser: 4.54 mg/dL — ABNORMAL HIGH (ref 0.4–1.5)
GFR calc Af Amer: 15 mL/min — ABNORMAL LOW (ref >60)

## 2011-01-18 LAB — CBC
MCH: 31.7 pg (ref 26.0–34.0)
MCHC: 33.3 g/dL (ref 30.0–36.0)
Platelets: 185 10*3/uL (ref 150–400)

## 2011-01-19 LAB — CBC
Hemoglobin: 10.1 g/dL — ABNORMAL LOW (ref 13.0–17.0)
MCHC: 32.9 g/dL (ref 30.0–36.0)
RBC: 3.26 MIL/uL — ABNORMAL LOW (ref 4.22–5.81)
WBC: 5.7 10*3/uL (ref 4.0–10.5)

## 2011-01-19 LAB — GLUCOSE, CAPILLARY
Glucose-Capillary: 253 mg/dL — ABNORMAL HIGH (ref 70–99)
Glucose-Capillary: 179 mg/dL — ABNORMAL HIGH (ref 70–99)

## 2011-01-19 LAB — BASIC METABOLIC PANEL
CO2: 26 mEq/L (ref 19–32)
Calcium: 9.3 mg/dL (ref 8.4–10.5)
GFR calc Af Amer: 21 mL/min — ABNORMAL LOW (ref >60)
Potassium: 3.6 mEq/L (ref 3.5–5.1)
Sodium: 137 mEq/L (ref 135–145)

## 2011-01-19 LAB — RENAL FUNCTION PANEL
Calcium: 9.2 mg/dL (ref 8.4–10.5)
Creatinine, Ser: 3.39 mg/dL — ABNORMAL HIGH (ref 0.4–1.5)
GFR calc Af Amer: 21 mL/min — ABNORMAL LOW (ref >60)
GFR calc non Af Amer: 17 mL/min — ABNORMAL LOW (ref >60)
Phosphorus: 4.4 mg/dL (ref 2.3–4.6)
Sodium: 137 mEq/L (ref 135–145)

## 2011-01-19 LAB — DIFFERENTIAL
Basophils Absolute: 0 10*3/uL (ref 0.0–0.1)
Basophils Relative: 1 % (ref 0–1)
Lymphocytes Relative: 17 % (ref 12–46)
Monocytes Absolute: 0.7 10*3/uL (ref 0.1–1.0)
Neutro Abs: 3.8 10*3/uL (ref 1.7–7.7)
Neutrophils Relative %: 68 % (ref 43–77)

## 2011-01-19 NOTE — Group Therapy Note (Signed)
  NAME:  [name], [name]                ACCOUNT NO.:  [id number]  MEDICAL RECORD NO.:  [id number]          PATIENT TYPE:  LOCATION:                                 FACILITY:  PHYSICIAN:  Freddie Nghiem L. [name], MDDATE OF BIRTH:  1925/02/14  DATE OF PROCEDURE: DATE OF DISCHARGE:                                PROGRESS NOTE   Please note that e-chart is down.  SUBJECTIVE:  No new complaints.  OBJECTIVE:  Blood pressure last night was 147/64, heart rate on telemetry is currently 80, temperature 98.1, respiratory rate 20, oxygen saturation last night was 92%.  He had 850 in yesterday for 24 hours then 825 out.  His weight today is not recorded.  On physical exam, his exam is unchanged from yesterday Jan 15, 2011, unable to get labs, but per Dr. Susa Griffins note from earlier this morning, his creatinine is still increasing of 4.42 and his potassium is normal at 3.6.  ASSESSMENT/PLAN: 1. Now end-stage renal disease:  The patient will get a Diatek     catheter placed, with [facility] in [place].  His Lovenox has been     held for the procedure.  After the Diatek has been placed, he can     have his femoral catheter removed. 2. Diabetes.  We will need to follow up his CBGs once e-chart comes     back up. 3. Atrial fibrillation, rate now controlled on current regimen, not a     good Coumadin candidate. 4. Peripheral vascular disease status post right below-knee     amputation. 5. Systolic dysfunction, improved congestive heart failure with     diuresis and dialysis. 6. Improving anasarca. 7. Hypertension has been better controlled since dialysis was started. 8. Anemia. 9. Previous left below-knee amputation. 10.Urinary retention:  Consider voiding trial once his weight approaches     dry weight to ensure improvement of his swelling of his genitalia. 11.Deconditioning.  He will eventually go to skilled nursing facility     hopefully later this week.     Jolene Guyett L.  [name], MD    CLS/MEDQ  D:  01/16/2011  T:  01/16/2011  Job:  [id number]  Electronically Signed by Crista Curb MD on 01/19/2011 10:44:39 AM

## 2011-01-20 ENCOUNTER — Inpatient Hospital Stay (HOSPITAL_COMMUNITY)

## 2011-01-20 LAB — GLUCOSE, CAPILLARY: Glucose-Capillary: 71 mg/dL (ref 70–99)

## 2011-01-21 LAB — GLUCOSE, CAPILLARY
Glucose-Capillary: 175 mg/dL — ABNORMAL HIGH (ref 70–99)
Glucose-Capillary: 284 mg/dL — ABNORMAL HIGH (ref 70–99)

## 2011-01-21 NOTE — Discharge Summary (Signed)
[name] [name], [name] [name]                ACCOUNT NO.:  [id number]  MEDICAL RECORD NO.:  [id number]           PATIENT TYPE:  I  LOCATION:  A328                          FACILITY:  [facility]  PHYSICIAN:  Elliot Cousin, M.D.    DATE OF BIRTH:  08-08-1925  DATE OF ADMISSION:  01/03/2011 DATE OF DISCHARGE:  05/26/2012LH                         DISCHARGE SUMMARY-REFERRING   DISCHARGE DIAGNOSES AND HOSPITAL COURSE: 1. Right below-the-knee amputation secondary to gangrene and     osteomyelitis.  The patient presented to the hospital on Jan 03, 2011, with a chief complaint of worsening right foot pain and     swelling.  He had previously refused amputation.  He underwent     evaluation with an arteriogram in March 2012, which revealed     extensive peripheral vascular disease.  He was discharged to home     on antibiotics for osteomyelitis in March 2012.  At home, the foot     became worse.  He specifically desired amputation.  He was started     on broad-spectrum antibiotics with Zosyn and vancomycin.  General     surgeon, Dr. Leticia Penna was consulted.  After he discussed the surgery     with the patient, he proceeded with a below-the-knee     amputation on the right on Jan 05, 2011.  The operation was     successful.  Antibiotics were discontinued following the operation.     He is now postoperative day #16.  The wound is clean and dry.     Staples are still in place.  Dr. Leticia Penna will remove the     staples on Jan 26, 2011.  He will see the patient back     in his office then. 2. Progression to end-stage renal disease.  The patient's renal     function worsened during the hospitalization.  He did have     underlying chronic kidney disease.  On admission, his creatinine     was 3.31 and his BUN was 48.  Nephrologist, Dr. Kristian Covey was     consulted.  He started a combination of Lasix and IV fluids.  At     one point, the patient was having difficulty urinating.  He was     examined and found to  have massive scrotal edema.  A Foley catheter     was placed, which prompted immediate urine flow.  Renal ultrasound     was ordered.  It revealed no evidence of hydronephrosis but an     increase in renal cortical echogenicity suggesting medical renal     disease.  It also revealed bilateral renal cysts, bilateral pleural     effusions, and small volume ascites.  The IV fluids were titrated     down and the dosing of Lasix was increased.  The patient continued     to make urine but his renal function continued to worsen.  It was     decided that the patient would need hemodialysis.  Dr. Kristian Covey     discussed this with the patient and his wife.  They both  agreed.     Dr. Leticia Penna placed a temporary femoral dialysis catheter.  The     patient was subsequently started on dialysis.  He has undergone     hemodialysis multiple times during this hospitalization.  At one     point, Dr. Kristian Covey stopped dialysis to see if his renal function     would stabilize or improve.  It did not.  Therefore, a permanent     hemodialysis catheter was placed by [facility] on Jan 17, 2011, via the right internal jugular route.  The patient is     still making urine.  However, he has now progressed to end-stage     renal disease.  At the time of discharge, his BUN was 40 and his     creatinine was 3.39. 3. Urinary retention secondary to scrotal edema, phimosis.  The     patient was noted to have anasarca and scrotal edema.  They were     treated appropriately with Lasix and with hemodialysis.  A voiding     trial was attempted a couple of days ago.  This was done after the     scrotal edema had subsided substantially.  However, the patient was     unable to urinate.  The nursing staff had difficulty reinserting     the Foley catheter.  Urologist, Dr. Jerre Simon was consulted.  He     evaluated the patient and found that he had phimosis.      Dr. Jerre Simon performed a cystoscopy and dorsal slit.      During the procedure, he found that the bladder had some bullous     edema but was otherwise unremarkable.  He made a dorsal slit in his     foreskin to pull the foreskin back and inserted a Foley     catheter, which promptly drained urine.  The Foley catheter will     stay in following discharge.  Dr. Jerre Simon wants to follow up with     the patient in his office on February 02, 2011 at 3:15 p.m. 4. Acute systolic congestive heart failure exacerbation.  Due to the     patient's progressive anasarca, a 2-D echocardiogram was ordered.     It revealed a severely reduced systolic function.  His ejection     fraction was in the range of 15-20%.  The echo also revealed severe     diffuse hypokinesis with apical akinesis of the left ventricle,     mild-to-moderate LVH, and paradoxical motion of the ventricular     septum.  He was maintained on beta blockade therapy and aspirin.     Altace was eventually added once the patient was transitioned to     permanent hemodialysis.  There had been no prior history of     coronary artery disease, but it is likely that he probably does     have some element of arterial disease.  Therefore, he was     maintained on Crestor, aspirin and metoprolol. 5. Chronic atrial fibrillation/atrial flutter.  The patient was     maintained on metoprolol.  Cardizem had to be started for rapid     ventricular rate.  He was deemed not to be a Coumadin candidate     prior to this hospitalization.  He was maintained on antiplatelet     therapy.  His rate improved on metoprolol and Cardizem. 6. Hypertension.  The patient's blood pressure had trended up  and down     during the hospital course depending on his fluid status.  His     blood pressure has been stable ranging in the 100-120 systolically     for the past few days.  He is being maintained on Altace,     diltiazem, and metoprolol. 7. Normocytic anemia.  The patient has anemia of chronic kidney     disease and anemia from  perioperative blood loss.  His anemia panel     revealed a total iron of 40, TIBC of 185, percent saturation of 22,     vitamin B12 of greater than 2000, folate of 10.5, and ferritin of     490.  He received Epogen with dialysis 3 times weekly.  His     hemoglobin prior to discharge was 10.1. 8. Metabolic acidosis and hyperphosphatemia secondary to progressive     chronic kidney disease.  Both his acidosis and hyperphosphatemia     had resolved following the start of hemodialysis. 9. History of previous cerebrovascular accident with left-sided     weakness. 10.History of left below-the-knee amputation in the past. 11.Mild dementia.  DISCHARGE MEDICATIONS: 1. Aspirin 81 mg daily. 2. Diltiazem  120 mg daily. 3. Epogen 4000 units IV 3 times weekly with hemodialysis. 4. Sliding-scale NovoLog with sensitive scale before each meal and at     bedtime. 5. Lantus insulin 10 units b.i.d. 6. Metoprolol 25 mg b.i.d. 7. MiraLax laxative 17 g added to water daily. 8. Altace 1.25 mg daily. 9. Crestor 10 mg daily. 10.Senokot-S 1 tablet nightly. 11.Torsemide 100 mg daily. 12.Tylenol 650 mg every 4 hours as needed for pain and fever. 13.Xopenex 0.63 mg inhaled every 4 hours as needed for shortness of     breath and chest congestion. 14.Hemodialysis to be scheduled Tuesdays, Thursdays and Saturdays.  CONSULTATIONS: 1. Jorja Loa, MD 2. Tilford Pillar, MD 3. Ky Barban, MD 4. Interventional radiologist, Ruel Favors MD  PROCEDURES PERFORMED: 1. Insertion of right femoral hemodialysis catheter.  It was removed. 2. Insertion of permanent hemodialysis catheter via right internal     jugular by Dr. Ruel Favors on Jan 17, 2011. 3. Hemodialysis. 4. Renal ultrasound on Jan 07, 2011. 5. A 2-D echocardiogram on Jan 09, 2011.  CODE STATUS:  Full code.  ACTIVITY LEVEL:  As recommended and tolerated with physical therapy.  DIET:  Medium carbohydrate-modified heart  healthy.  DISCHARGE DISPOSITION:  The patient was discharged to the [facility] [place] and [place] condition.  He has a followup appointment to see Dr. Tilford Pillar on Jan 26, 2011 at 2:45 p.m.  He has an appointment to follow up with Dr. Alleen Borne on February 02, 2011 at 3:15 p.m.  SUBJECTIVE:  The patient is currently sitting up in bed in no acute distress.  He has no complaints of chest pain, shortness of breath or abdominal pain.  He says that his appetite is good.  OBJECTIVE:  VITAL SIGNS:  Temperature 97, pulse 91, respiratory rate 24, blood pressure 118/61, oxygen saturation 99% on room air.  Capillary blood glucose 175. LUNGS:  Decreased breath sounds in the bases and clear otherwise.  His breathing is nonlabored. HEART:  S1-S2 with ectopic beats versus irregularly irregular. ABDOMEN:  Positive bowel sounds, soft, nontender, nondistended.  No hepatosplenomegaly.  No masses palpated. GU: There is an indwelling Foley catheter.  There is a little dried blood at his urethral meatus.  Nontender.  The Foley catheter is draining  yellow urine. RECTAL:  Deferred. EXTREMITIES:  There is staples in place in the right lower extremity stump.  No surrounding erythema or drainage.  Nontender.  There is a well-healed scar on his left stump.  There is no edema. NEUROLOGIC:  The patient is alert and oriented to himself in hospital.He is aware that he may be discharged today.  No current laboratory data today.  ASSESSMENT/PLAN:  As above.     Elliot Cousin, M.D.     DF/MEDQ  D:  01/21/2011  T:  01/21/2011  Job:  [id number]  cc:   Dr. Charisse Klinefelter, M.D. Fax: [phone]  Ky Barban, M.D. Fax: [phone]  Tilford Pillar, MD Fax: [phone]  Electronically Signed by Elliot Cousin M.D. on 01/21/2011 11:48:02 AM

## 2011-01-23 LAB — GLUCOSE, CAPILLARY: Glucose-Capillary: 142 mg/dL — ABNORMAL HIGH (ref 70–99)

## 2011-01-26 NOTE — Op Note (Signed)
  [name], [name] [name]                ACCOUNT NO.:  [id number]  MEDICAL RECORD NO.:  [id number]           PATIENT TYPE:  LOCATION:                                 FACILITY:  PHYSICIAN:  Ky Barban, M.D.DATE OF BIRTH:  1924-09-04  DATE OF PROCEDURE:  01/20/2011 DATE OF DISCHARGE:                              OPERATIVE REPORT   PREOPERATIVE DIAGNOSIS:  Phimosis and renal failure.  POSTOPERATIVE DIAGNOSIS:  Phimosis and renal failure.  PROCEDURE:  Cystoscopy and dorsal slit.  SURGEON:  Ky Barban, MD  ANESTHESIA:  Xylocaine 1% about 5 mL as local anesthesia.  PROCEDURE IN DETAIL:  This patient was placed in supine position.  After usual prep and drape, I was planning to do circumcision because he has severe phimosis and needed a Foley catheter and I could not find his orifice, but on examination on the table I discovered that maybe he will be okay just doing a dorsal slit, so I injected the dorsal prepuce with 1% Xylocaine, I used about 5 mL.  A dorsal slit was made, the meatus was exposed, it was grabbed the help of an Allis clamp and flexible cystoscope was introduced under direct vision.  I went into the prostatic urethra which looks open.  Bladder showed some bullous edema, otherwise unremarkable.  Could not see the orifices because the bullous edema is on the trigone.  The cystoscope was removed and a Foley catheter #18 was inserted which was draining fine and I had already made a dorsal slit.  So, I closed the mucosa and the skin and the dorsal slit with interrupted sutures of 3-0 chromic and then with bacitracin ointment.  Dressing was applied to the wound.  The patient left the operating room in satisfactory condition.     Ky Barban, M.D.     MIJ/MEDQ  D:  01/20/2011  T:  01/21/2011  Job:  [id number]  Electronically Signed by Alleen Borne M.D. on 01/26/2011 03:09:44 PM

## 2011-03-19 ENCOUNTER — Encounter: Payer: Self-pay | Admitting: *Deleted

## 2011-03-19 ENCOUNTER — Inpatient Hospital Stay (HOSPITAL_COMMUNITY)
Admission: EM | Admit: 2011-03-19 | Discharge: 2011-03-22 | DRG: 690 | Disposition: A | Discharge status: Skilled Nursing Facility | Payer: Medicare Other | Attending: Internal Medicine | Admitting: Internal Medicine

## 2011-03-19 DIAGNOSIS — D72819 Decreased white blood cell count, unspecified: Secondary | ICD-10-CM | POA: Diagnosis present

## 2011-03-19 DIAGNOSIS — I2589 Other forms of chronic ischemic heart disease: Secondary | ICD-10-CM | POA: Diagnosis present

## 2011-03-19 DIAGNOSIS — Z9181 History of falling: Secondary | ICD-10-CM

## 2011-03-19 DIAGNOSIS — R5381 Other malaise: Secondary | ICD-10-CM | POA: Diagnosis present

## 2011-03-19 DIAGNOSIS — S88119A Complete traumatic amputation at level between knee and ankle, unspecified lower leg, initial encounter: Secondary | ICD-10-CM

## 2011-03-19 DIAGNOSIS — I129 Hypertensive chronic kidney disease with stage 1 through stage 4 chronic kidney disease, or unspecified chronic kidney disease: Secondary | ICD-10-CM | POA: Diagnosis present

## 2011-03-19 DIAGNOSIS — N189 Chronic kidney disease, unspecified: Secondary | ICD-10-CM | POA: Diagnosis present

## 2011-03-19 DIAGNOSIS — N390 Urinary tract infection, site not specified: Principal | ICD-10-CM | POA: Diagnosis present

## 2011-03-19 DIAGNOSIS — R296 Repeated falls: Secondary | ICD-10-CM

## 2011-03-19 DIAGNOSIS — I509 Heart failure, unspecified: Secondary | ICD-10-CM | POA: Diagnosis present

## 2011-03-19 DIAGNOSIS — N183 Chronic kidney disease, stage 3 unspecified: Secondary | ICD-10-CM | POA: Diagnosis present

## 2011-03-19 DIAGNOSIS — E1129 Type 2 diabetes mellitus with other diabetic kidney complication: Secondary | ICD-10-CM | POA: Diagnosis present

## 2011-03-19 DIAGNOSIS — N058 Unspecified nephritic syndrome with other morphologic changes: Secondary | ICD-10-CM | POA: Diagnosis present

## 2011-03-19 DIAGNOSIS — I5022 Chronic systolic (congestive) heart failure: Secondary | ICD-10-CM | POA: Diagnosis present

## 2011-03-19 DIAGNOSIS — D631 Anemia in chronic kidney disease: Secondary | ICD-10-CM | POA: Diagnosis present

## 2011-03-19 DIAGNOSIS — I10 Essential (primary) hypertension: Secondary | ICD-10-CM | POA: Diagnosis present

## 2011-03-19 DIAGNOSIS — N039 Chronic nephritic syndrome with unspecified morphologic changes: Secondary | ICD-10-CM | POA: Diagnosis present

## 2011-03-19 HISTORY — DX: Dependence on renal dialysis: Z99.2

## 2011-03-19 HISTORY — DX: Hemiplegia and hemiparesis following cerebral infarction affecting unspecified side: I69.359

## 2011-03-19 HISTORY — DX: Reserved for concepts with insufficient information to code with codable children: IMO0002

## 2011-03-19 HISTORY — DX: Heart failure, unspecified: I50.9

## 2011-03-19 HISTORY — DX: Reserved for inherently not codable concepts without codable children: IMO0001

## 2011-03-19 HISTORY — DX: Essential (primary) hypertension: I10

## 2011-03-19 HISTORY — DX: Unspecified dementia, unspecified severity, without behavioral disturbance, psychotic disturbance, mood disturbance, and anxiety: F03.90

## 2011-03-19 HISTORY — DX: Anemia in chronic kidney disease: D63.1

## 2011-03-19 HISTORY — DX: Acquired absence of right leg below knee: Z89.511

## 2011-03-19 HISTORY — DX: Chronic kidney disease, unspecified: N18.9

## 2011-03-19 HISTORY — DX: Peripheral vascular disease, unspecified: I73.9

## 2011-03-19 HISTORY — DX: Acquired absence of left leg below knee: Z89.512

## 2011-03-19 LAB — CBC
Hemoglobin: 10 g/dL — ABNORMAL LOW (ref 13.0–17.0)
MCH: 30.5 pg (ref 26.0–34.0)
MCHC: 33.1 g/dL (ref 30.0–36.0)
MCV: 92.1 fL (ref 78.0–100.0)
RBC: 3.28 MIL/uL — ABNORMAL LOW (ref 4.22–5.81)
RDW: 16.5 % — ABNORMAL HIGH (ref 11.5–15.5)

## 2011-03-19 LAB — BASIC METABOLIC PANEL
BUN: 38 mg/dL — ABNORMAL HIGH (ref 6–23)
CO2: 25 mEq/L (ref 19–32)
Calcium: 9.4 mg/dL (ref 8.4–10.5)
Chloride: 104 mEq/L (ref 96–112)
Creatinine, Ser: 1.49 mg/dL — ABNORMAL HIGH (ref 0.50–1.35)
Glucose, Bld: 181 mg/dL — ABNORMAL HIGH (ref 70–99)

## 2011-03-19 LAB — URINALYSIS, ROUTINE W REFLEX MICROSCOPIC
Bilirubin Urine: NEGATIVE
Glucose, UA: NEGATIVE mg/dL
Ketones, ur: NEGATIVE mg/dL
Protein, ur: NEGATIVE mg/dL
pH: 5.5 (ref 5.0–8.0)

## 2011-03-19 LAB — URINE MICROSCOPIC-ADD ON

## 2011-03-19 LAB — GLUCOSE, CAPILLARY: Glucose-Capillary: 162 mg/dL — ABNORMAL HIGH (ref 70–99)

## 2011-03-19 MED ORDER — CIPROFLOXACIN HCL 250 MG PO TABS
500.0000 mg | ORAL_TABLET | Freq: Once | ORAL | Status: AC
  Administered 2011-03-19: 500 mg via ORAL
  Filled 2011-03-19: qty 2

## 2011-03-19 MED ORDER — CIPROFLOXACIN HCL 500 MG PO TABS: 500.0000 mg | ORAL_TABLET | Freq: Two times a day (BID) | ORAL | Status: DC

## 2011-03-19 NOTE — ED Notes (Signed)
[facility] contacted. Tim, Environmental education officer, spoke with Margaretha Glassing with [facility]. Reports they will be in contact with patient tomorrow.

## 2011-03-19 NOTE — ED Notes (Addendum)
Demographic sheet, Home health evaluation order, and H&P faxed to [facility]. Confirmation received that fax was sent at 2054.

## 2011-03-19 NOTE — ED Notes (Signed)
Advised patient that we needed a urine specimen - patient given a urinal

## 2011-03-19 NOTE — ED Provider Notes (Addendum)
History     Chief Complaint  Patient presents with  . Fall   HPI Comments: Patient presents with daughter who has recently come from another state to help take care of him. He was recently admitted to the hospital for a right below the knee amputation. He has already had a left below-the-knee amputation. When he left the nursing facility where he had been staying for rehabilitation approximately 5 days ago, he was found in a ditch when his electric wheelchair and runoff around. He had laid there for approximately 2 hours prior to being found. Since that time over the last several days he has been at home and has had several falls do to difficulty transitioning from chair to bed or chair to stationary chair. He denies head injuries, cough, fever, vomiting, rashes. He has had some mild serosanguineous drainage from the right stump. He does not have any home health care because he left the rehabilitation facility AGAINST MEDICAL ADVICE due to a falling out with the staff.  Patient is a 77 y.o. male presenting with fall. The history is provided by the patient and a relative.  Fall    Past Medical History  Diagnosis Date  . Diabetes mellitus   . Hypertension   . Renal disorder     Past Surgical History  Procedure Date  . Leg amputation below knee     bilateral    No family history on file.  History  Substance Use Topics  . Smoking status: Former Games developer  . Smokeless tobacco: Not on file  . Alcohol Use: No      Review of Systems  All other systems reviewed and are negative.    Physical Exam  BP 107/67  Pulse 75  Temp(Src) 98 F (36.7 C) (Oral)  Resp 16  Ht 5\' 8"  (1.727 m)  Wt 160 lb (72.576 kg)  BMI 24.33 kg/m2  SpO2 100%  Physical Exam  Constitutional: He appears well-developed and well-nourished. No distress.  HENT:  Head: Normocephalic and atraumatic.  Mouth/Throat: Oropharynx is clear and moist. No oropharyngeal exudate.  Eyes: Conjunctivae and EOM are normal.  Pupils are equal, round, and reactive to light. Right eye exhibits no discharge. Left eye exhibits no discharge. No scleral icterus.  Neck: Normal range of motion. Neck supple. No JVD present. No thyromegaly present.  Cardiovascular: Normal rate, regular rhythm, normal heart sounds and intact distal pulses.  Exam reveals no gallop and no friction rub.   No murmur heard. Pulmonary/Chest: Effort normal and breath sounds normal. No respiratory distress. He has no wheezes. He has no rales.  Abdominal: Soft. Bowel sounds are normal. He exhibits no distension and no mass. There is no tenderness.  Musculoskeletal: Normal range of motion.       Normal range of motion of both legs including the stumps. No edema, mild serosanguineous drainage from a 2 cm healing wound to the right stump. There is no tenderness, erythema, warmth to this leg.  Lymphadenopathy:    He has no cervical adenopathy.  Neurological: He is alert. Coordination normal.  Skin: Skin is warm and dry. No rash noted. He is not diaphoretic. No erythema.  Psychiatric: He has a normal mood and affect. His behavior is normal.    ED Course  Procedures  MDM Overall patient appears well without complaints. Vital signs are normal. Laboratories results reviewed and show a urinary tract infection, mild anemia, mild renal insufficiency. Will start IV antibiotics and begin arrangements for home health if possible.  Results for orders placed during the hospital encounter of 03/19/11  CBC      Component Value Range   WBC 3.4 (*) 4.0 - 10.5 (K/uL)   RBC 3.28 (*) 4.22 - 5.81 (MIL/uL)   Hemoglobin 10.0 (*) 13.0 - 17.0 (g/dL)   HCT 16.1 (*) 09.6 - 52.0 (%)   MCV 92.1  78.0 - 100.0 (fL)   MCH 30.5  26.0 - 34.0 (pg)   MCHC 33.1  30.0 - 36.0 (g/dL)   RDW 04.5 (*) 40.9 - 15.5 (%)   Platelets 180  150 - 400 (K/uL)  URINALYSIS, ROUTINE W REFLEX MICROSCOPIC      Component Value Range   Color, Urine YELLOW  YELLOW    Appearance HAZY (*) CLEAR     Specific Gravity, Urine 1.015  1.005 - 1.030    pH 5.5  5.0 - 8.0    Glucose, UA NEGATIVE  NEGATIVE (mg/dL)   Hgb urine dipstick TRACE (*) NEGATIVE    Bilirubin Urine NEGATIVE  NEGATIVE    Ketones, ur NEGATIVE  NEGATIVE (mg/dL)   Protein, ur NEGATIVE  NEGATIVE (mg/dL)   Urobilinogen, UA 1.0  0.0 - 1.0 (mg/dL)   Nitrite NEGATIVE  NEGATIVE    Leukocytes, UA MODERATE (*) NEGATIVE   BASIC METABOLIC PANEL      Component Value Range   Sodium 140  135 - 145 (mEq/L)   Potassium 4.3  3.5 - 5.1 (mEq/L)   Chloride 104  96 - 112 (mEq/L)   CO2 25  19 - 32 (mEq/L)   Glucose, Bld 181 (*) 70 - 99 (mg/dL)   BUN 38 (*) 6 - 23 (mg/dL)   Creatinine, Ser 8.11 (*) 0.50 - 1.35 (mg/dL)   Calcium 9.4  8.4 - 91.4 (mg/dL)   GFR calc non Af Amer 45 (*) >60 (mL/min)   GFR calc Af Amer 54 (*) >60 (mL/min)  GLUCOSE, CAPILLARY      Component Value Range   Glucose-Capillary 162 (*) 70 - 99 (mg/dL)  URINE MICROSCOPIC-ADD ON      Component Value Range   Squamous Epithelial / LPF FEW (*) RARE    WBC, UA TOO NUMEROUS TO COUNT  <3 (WBC/hpf)   RBC / HPF 11-20  <3 (RBC/hpf)   Bacteria, UA MANY (*) RARE    Urine-Other YEAST     No results found.   Culture sent, Cipro started, nursing attempting to arrange home care.     Vida Roller, MD 03/19/11 1851  Patient's family member states that patient is not safe at home due to his elderly wife said ability to take care of him. Will admit for placement and further treatment of his urinary tract infection. I discussed his care with the triad hospitalist who agrees with admission to  Vida Roller, MD 03/19/11 2308

## 2011-03-19 NOTE — ED Notes (Signed)
Had right knee amputation x 2 months ago here, d/c home from [facility] x 5 days ago.  Since being home, pt has fallen 3 times per daughter.  On Tues, daughter states pt was found in ditch outside by [employer] and had been in ditch x 2 hrs.  Daughter states pt was not evaluated at that time.  Pt unable to recall if he hit is head.  C/o wound drainage from surgical site to right leg.  Pt denies pain at this time.  Daughter wants to arrange home health care and physical therapy at home.  Pt is mobile at home via electric w/c.  Pt is alert and oriented in triage.

## 2011-03-19 NOTE — ED Notes (Signed)
Awaiting call from case management about home visits. Patient's daughter reports she is uncomfortable with patient being home. Patient lives with elderly wife, and has had recent falls at home. Daughter reports she is from [place] and needs to return home. Daughter concerned for patient's well being. Requesting to talk with Dr. Hyacinth Meeker. Dr. Hyacinth Meeker made aware.

## 2011-03-20 ENCOUNTER — Encounter (HOSPITAL_COMMUNITY): Payer: Self-pay | Admitting: *Deleted

## 2011-03-20 DIAGNOSIS — N390 Urinary tract infection, site not specified: Secondary | ICD-10-CM | POA: Diagnosis present

## 2011-03-20 DIAGNOSIS — I10 Essential (primary) hypertension: Secondary | ICD-10-CM | POA: Diagnosis present

## 2011-03-20 DIAGNOSIS — R296 Repeated falls: Secondary | ICD-10-CM

## 2011-03-20 DIAGNOSIS — D631 Anemia in chronic kidney disease: Secondary | ICD-10-CM | POA: Diagnosis present

## 2011-03-20 DIAGNOSIS — D72819 Decreased white blood cell count, unspecified: Secondary | ICD-10-CM | POA: Diagnosis present

## 2011-03-20 DIAGNOSIS — E1129 Type 2 diabetes mellitus with other diabetic kidney complication: Secondary | ICD-10-CM | POA: Diagnosis present

## 2011-03-20 DIAGNOSIS — I509 Heart failure, unspecified: Secondary | ICD-10-CM | POA: Diagnosis present

## 2011-03-20 LAB — GLUCOSE, CAPILLARY: Glucose-Capillary: 106 mg/dL — ABNORMAL HIGH (ref 70–99)

## 2011-03-20 MED ORDER — SODIUM CHLORIDE 0.9 % IJ SOLN
3.0000 mL | INTRAMUSCULAR | Status: DC | PRN
  Administered 2011-03-21: 3 mL via INTRAVENOUS

## 2011-03-20 MED ORDER — FUROSEMIDE 20 MG PO TABS: 25.0000 mg | ORAL_TABLET | Freq: Every day | ORAL | Status: DC

## 2011-03-20 MED ORDER — ENOXAPARIN SODIUM 30 MG/0.3ML ~~LOC~~ SOLN
30.0000 mg | SUBCUTANEOUS | Status: DC
  Administered 2011-03-20 – 2011-03-22 (×3): 30 mg via SUBCUTANEOUS
  Filled 2011-03-20 (×3): qty 0.3

## 2011-03-20 MED ORDER — LISINOPRIL 10 MG PO TABS
20.0000 mg | ORAL_TABLET | Freq: Every day | ORAL | Status: DC
  Administered 2011-03-20 – 2011-03-22 (×3): 20 mg via ORAL
  Filled 2011-03-20 (×3): qty 2

## 2011-03-20 MED ORDER — INSULIN ASPART 100 UNIT/ML ~~LOC~~ SOLN: 0.0000 [IU] | Freq: Every day | SUBCUTANEOUS | Status: DC

## 2011-03-20 MED ORDER — SODIUM CHLORIDE 0.9 % IJ SOLN
3.0000 mL | Freq: Two times a day (BID) | INTRAMUSCULAR | Status: DC
  Administered 2011-03-20 – 2011-03-22 (×6): 3 mL via INTRAVENOUS
  Filled 2011-03-20 (×5): qty 3

## 2011-03-20 MED ORDER — SIMVASTATIN 20 MG PO TABS
40.0000 mg | ORAL_TABLET | Freq: Every day | ORAL | Status: DC
  Administered 2011-03-20 – 2011-03-21 (×2): 40 mg via ORAL
  Filled 2011-03-20 (×2): qty 2

## 2011-03-20 MED ORDER — CEFTRIAXONE SODIUM 1 G IJ SOLR
1.0000 g | INTRAMUSCULAR | Status: DC
  Filled 2011-03-20 (×3): qty 1

## 2011-03-20 MED ORDER — SPIRONOLACTONE 25 MG PO TABS
25.0000 mg | ORAL_TABLET | Freq: Once | ORAL | Status: AC
  Administered 2011-03-20: 25 mg via ORAL
  Filled 2011-03-20: qty 1

## 2011-03-20 MED ORDER — ASPIRIN 81 MG PO CHEW
81.0000 mg | CHEWABLE_TABLET | Freq: Every day | ORAL | Status: DC
  Administered 2011-03-20 – 2011-03-22 (×3): 81 mg via ORAL
  Filled 2011-03-20 (×3): qty 1

## 2011-03-20 MED ORDER — METOPROLOL TARTRATE 25 MG PO TABS
25.0000 mg | ORAL_TABLET | Freq: Two times a day (BID) | ORAL | Status: DC
  Administered 2011-03-20 – 2011-03-22 (×6): 25 mg via ORAL
  Filled 2011-03-20 (×6): qty 1

## 2011-03-20 MED ORDER — FUROSEMIDE 20 MG PO TABS
30.0000 mg | ORAL_TABLET | Freq: Every day | ORAL | Status: DC
  Administered 2011-03-20 – 2011-03-22 (×3): 30 mg via ORAL
  Filled 2011-03-20 (×3): qty 2

## 2011-03-20 MED ORDER — CIPROFLOXACIN HCL 250 MG PO TABS
250.0000 mg | ORAL_TABLET | Freq: Two times a day (BID) | ORAL | Status: DC
  Filled 2011-03-20: qty 1

## 2011-03-20 MED ORDER — INSULIN ASPART 100 UNIT/ML ~~LOC~~ SOLN
0.0000 [IU] | Freq: Three times a day (TID) | SUBCUTANEOUS | Status: DC
  Administered 2011-03-20 – 2011-03-21 (×2): 3 [IU] via SUBCUTANEOUS
  Administered 2011-03-21 (×2): 1 [IU] via SUBCUTANEOUS
  Administered 2011-03-22: 3 [IU] via SUBCUTANEOUS
  Administered 2011-03-22: 1 [IU] via SUBCUTANEOUS
  Filled 2011-03-20: qty 3

## 2011-03-20 MED ORDER — ASPIRIN 81 MG PO TABS: 81.0000 mg | ORAL_TABLET | Freq: Every day | ORAL | Status: DC

## 2011-03-20 MED ORDER — DEXTROSE 5 % IV SOLN
1.0000 g | INTRAVENOUS | Status: DC
  Administered 2011-03-20 – 2011-03-22 (×3): 1 g via INTRAVENOUS
  Filled 2011-03-20 (×4): qty 1

## 2011-03-20 MED ORDER — SODIUM CHLORIDE 0.9 % IJ SOLN
INTRAMUSCULAR | Status: AC
  Administered 2011-03-20: 10 mL via INTRAVENOUS
  Filled 2011-03-20: qty 10

## 2011-03-20 MED ORDER — SODIUM CHLORIDE 0.9 % IJ SOLN
3.0000 mL | Freq: Two times a day (BID) | INTRAMUSCULAR | Status: DC
  Administered 2011-03-20: 3 mL via INTRAVENOUS
  Filled 2011-03-20: qty 3

## 2011-03-20 NOTE — H&P (Signed)
  Job [id number]

## 2011-03-20 NOTE — Progress Notes (Signed)
Inpatient Diabetes Program Recommendations  [facility]/ADA: New Consensus Statement on Inpatient Glycemic Control (2009)  Target Ranges:  Prepandial:   less than 140 mg/dL      Peak postprandial:   less than 180 mg/dL (1-2 hours)      Critically ill patients:  140 - 180 mg/dL   Reason for Visit: Elevated CBG 207 mg/dL  Inpatient Diabetes Program Recommendations Correction (SSI): Add sensitive Novolog correction TID AND HS

## 2011-03-20 NOTE — H&P (Signed)
[name] [name], [name]                ACCOUNT NO.:  [id number]  MEDICAL RECORD NO.:  [id number]  LOCATION:  A310                          FACILITY:  [facility]  PHYSICIAN:  Tarry Kos, MD       DATE OF BIRTH:  [birth year]  DATE OF ADMISSION:  03/19/2011 DATE OF DISCHARGE:  LH                             HISTORY & PHYSICAL   CHIEF COMPLAINT:  Frequent falls and needs placement.  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  He left in his electronically controlled wheelchair from the facility and the family was called and told that he left in his wheelchair and then was missing for several hours and then later on was found in a ditch, laying in a ditch because he had fallen out of his wheelchair.  He has been home since then which has approximately been 5 days.  He lives with his wife who is also elderly and fragile.  He has fallen 4 or 5 times a day. His wife cannot handle him at home and so the family brought him in for placement versus further rehab.  His daughter is with him right now who had a flying in from [place].  He has been in his normal state of health other than the issue with him leaving his Rehab Center against medical advice.  He was in the process of being fitted for prosthetics. However, that toe process has been interrupted due to his leaving that facility abruptly.  REVIEW OF SYSTEMS:  Otherwise, negative.  PAST MEDICAL HISTORY: 1. Again, recent right below-the-knee amputation in May 2012. 2. End-stage renal disease recently on dialysis currently not     requiring dialysis. 3. Status post circumcision due to phimosis during last     hospitalization. 4. History of  congestive heart failure with an EF of 15-20%. 5. Chronic AFib, a-flutter. 6. Hypertension. 7. Normocytic anemia. 8. History of left below-the-knee amputation. 9. Mild dementia. 10.Peripheral vascular disease. 11.Diabetes type 2.  ALLERGIES:  None.  SOCIAL HISTORY:  Again, he has 6 total children several of them are deceased.  He has got 4 living children.  He lives with his wife who again cannot lift him and help him transfer and he cannot really transfer himself from bed to wheelchair, wheelchair to bathroom, etc.  CODE STATUS:  Verified as full code.  FAMILY HISTORY:  Noncontributory.  MEDICATIONS:  Please see epic list.  PHYSICAL EXAMINATION:  VITALS:  Temperature is 98, pulse 64, respirations 18, blood pressure 127/66 and 100% O2 sats on room air. GENERAL:  He is alert and he is oriented to person, place and time. However, he does easily get confused with questioning.  He is in no apparent distress. HEENT:  Extraocular muscles intact.  Pupils are equal and reactive to light.  Oropharynx clear.  Mucous membranes moist. NECK:  No JVD and no carotid bruits. COR:  Regular  rate and rhythm without murmurs, rubs or gallops. CHEST:  Clear to auscultation bilaterally.  No wheezes, thrills or rubs. ABDOMEN:  Soft, nontender and nondistended.  Positive bowel sounds.  No hepatosplenomegaly. EXTREMITIES:  No clubbing, cyanosis or edema.  His right wound BKA appears well healing.  He does have a very small area that is still healing.  It appears noninfected.  There is no surrounding cellulitis. There is no discharge. SKIN:  No rashes. PSYCH:  Normal affect.  No focal neurologic deficits.  LABORATORY DATA:  Creatinine is 1.49, potassium is normal.  Hemoglobin is 10.  Urinalysis does show moderate leukocytes, too many to count white blood cells and many bacteria.  ASSESSMENT AND PLAN:  This is an 77 year old male with frequent falls and a urinary tract infection and one frequent  falls and a urinary tract infection.  I will place him on ciprofloxacin orally twice a day.  Urine culture has been sent off.  He is being obvious overnight, so we can either get him placement or back into his short-term rehab facility. [name] [name] is agreeable to go into a different rehab facility then he was that before.  Social work will be consulted and all of his chronic medical issues are stable.  There are no active issues except the mild urinary tract infection.  He is a full code.  Further recommendations depending on overall hospital course.                                           ______________________________ Tarry Kos, MD     RD/MEDQ  D:  03/20/2011  T:  03/20/2011  Job:  [id number]

## 2011-03-20 NOTE — Progress Notes (Signed)
Physical Therapy Evaluation Patient Name: [name] [name] Date: 03/20/2011 Problem List:  Patient Active Problem List  Diagnoses  . UTI (lower urinary tract infection)   Past Medical History:  Past Medical History  Diagnosis Date  . Diabetes mellitus   . Hypertension   . Dialysis patient     pt states he recently was taken off dialysis  . CKD (chronic kidney disease)   . CHF (congestive heart failure), NYHA class IV     EF 15-20%  . Atrial fibrillation or flutter   . PVD (peripheral vascular disease)   . Anemia associated with chronic renal failure   . S/P BKA (below knee amputation) bilateral   . Dementia   . CVA, old, hemiparesis     Left-sided weakness   Past Surgical History:  Past Surgical History  Procedure Date  . Leg amputation below knee     bilateral    Precautions/Restrictions  Precautions Precautions: Fall Prior Functioning  Home Living Type of Home: House Lives With: Spouse Receives Help From: Family Home Layout: One level Home Access: Ramped entrance Bathroom Shower/Tub: Engineer, manufacturing systems: Standard Bathroom Accessibility: Yes How Accessible: Accessible via wheelchair Home Adaptive Equipment: Wheelchair - manual;Wheelchair - powered;Reacher Prior Function Level of Independence: Independent with transfers;Needs assistance with ADLs Vocation: Retired Financial risk analyst Arousal/Alertness: Awake/alert Overall Cognitive Status: Appears within functional limits for tasks assessed Sensation/Coordination Sensation Light Touch: Appears Intact Extremity Assessment RUE Assessment RUE Assessment: Within Functional Limits LUE Assessment LUE Assessment: Within Functional Limits RLE Assessment RLE Assessment: Within Functional Limits LLE Assessment LLE Assessment: Within Functional Limits Mobility (including Balance) Bed Mobility Bed Mobility: Yes (independent with rolling) Transfers Transfers: Yes (transfers independently be to  w/c) Ambulation/Gait Ambulation/Gait: No Stairs: No Wheelchair Mobility Wheelchair Mobility:  (independent)  Posture/Postural Control Posture/Postural Control: No significant limitations Balance Balance Assessed:  (sitting balance is WNL) Exercise     End of Session PT - End of Session Activity Tolerance: Patient tolerated treatment well Patient left: in bed;with bed alarm set;with call bell in reach Nurse Communication: Other (comment) (spoke with Child psychotherapist) General Behavior During Session: [facility] for tasks performed Cognition: [facility] for tasks performed PT Assessment/Plan/Recommendation PT Assessment Clinical Impression Statement: Pt. has reached max rehab potential until he is able to receive prosthetic legs PT Recommendation/Assessment: All further PT needs can be met in the next venue of care (needs HHPT to assist with adaptive needs in the home) PT Goals    [name] [name] 03/20/2011, 9:26 AM

## 2011-03-20 NOTE — Progress Notes (Signed)
Subjective: The patient is currently sitting up in his bed. He has no complaints of chest pain shortness of breath or pain with urination. His daughter French Ana is in the room. Everyone is in agreement that he needs skilled nursing facility placement for ongoing rehabilitation.  Objective: Vital signs in last 24 hours: Filed Vitals:   03/19/11 2106 03/19/11 2330 03/20/11 0110 03/20/11 0538  BP: 112/75 127/66 119/82 122/70  Pulse: 85 64 111 84  Temp:   97.9 F (36.6 C) 98.1 F (36.7 C)  TempSrc:   Oral   Resp: 18  16 20   Height:   4\' 4"  (1.321 m)   Weight:   59.1 kg (130 lb 4.7 oz)   SpO2: 100% 100% 100% 100%    Intake/Output Summary (Last 24 hours) at 03/20/11 1135 Last data filed at 03/20/11 8119  Gross per 24 hour  Intake    803 ml  Output      0 ml  Net    803 ml    Weight change:   General appearance: alert, cooperative and appears stated age Resp: clear to auscultation bilaterally Cardio: irregularly irregular rhythm GI: soft, non-tender; bowel sounds normal; no masses,  no organomegaly Extremities:  bilateral amputee, below the knee. There is a small scab on the right, nonbleeding and non-erythematous. No stump edema.  Lab Results:  [facility] 03/19/11 1635  NA 140  K 4.3  CL 104  CO2 25  GLUCOSE 181*  BUN 38*  CREATININE 1.49*  CALCIUM 9.4  ALKPHOS --  BILITOT --  AST --  ALT --  PROT --  ALBUMIN --    Basename 03/19/11 1635  WBC 3.4*  NEUTROABS --  HGB 10.0*  MCV 92.1  PLT 180   No results found for this basename: TSH:2,FT4:2,FT3:2 LIPASE:2,FERRITN:2, IRON:2,TIBC:2,AMYLASE:2, INR:2,PTT:2 in the last 72 hours    Studies/Results: No results found.  Medications: I have reviewed the patient's current medications.  Assessment:  UTI (lower urinary tract infection): On oral Cipro.   DM (diabetes mellitus) type II controlled with renal manifestation. We will need to start sliding-scale insulin.  CKD (chronic kidney disease), stage III; previously  treated with dialysis.   HTN (hypertension). Controlled.   Leukopenia. Will follow.   CHF (congestive heart failure), NYHA class IV. Compensated. On cardiac meds.   Anemia associated with chronic renal failure. Stable.   Frequent falls: Pt will need ongoing rehab as he left the SNF AMA last week. He has had frequent falls in his wheelchair. Pt and family are in agreement with his return to SNF for strengthening  And prosthesis mobility.    Plan:  1. Will discontinue Cipro and start Rocephin IV.  2. Will consult the clinical social worker to assess for skilled nursing facility placement for rehabilitation. 3. Will check laboratory studies in the morning.   LOS: 1 day   Julianny Milstein 03/20/2011, 11:35 AM

## 2011-03-20 NOTE — Progress Notes (Signed)
Initial skin assessment performed and verified by K. Maisie Fus, RN. Scattered healing scabs to R thigh pt reports them as "water blisters". Two small open "blisters" noted on R thigh and knee. Bilateral BKA. Healed over scabs to bilateral stumps. Shelba Flake, RN

## 2011-03-20 NOTE — ED Notes (Signed)
Report given to Nadine, RN.

## 2011-03-21 LAB — CBC
HCT: 34.7 % — ABNORMAL LOW (ref 39.0–52.0)
MCHC: 32.9 g/dL (ref 30.0–36.0)
MCV: 92.3 fL (ref 78.0–100.0)
Platelets: 175 10*3/uL (ref 150–400)
RDW: 16.7 % — ABNORMAL HIGH (ref 11.5–15.5)
WBC: 5.3 10*3/uL (ref 4.0–10.5)

## 2011-03-21 LAB — BASIC METABOLIC PANEL
BUN: 28 mg/dL — ABNORMAL HIGH (ref 6–23)
Chloride: 102 mEq/L (ref 96–112)
Creatinine, Ser: 1.04 mg/dL (ref 0.50–1.35)
GFR calc Af Amer: >60 mL/min (ref >60)
GFR calc non Af Amer: >60 mL/min (ref >60)
Potassium: 4.3 mEq/L (ref 3.5–5.1)

## 2011-03-21 LAB — GLUCOSE, CAPILLARY
Glucose-Capillary: 141 mg/dL — ABNORMAL HIGH (ref 70–99)
Glucose-Capillary: 206 mg/dL — ABNORMAL HIGH (ref 70–99)

## 2011-03-21 MED ORDER — SODIUM CHLORIDE 0.9 % IJ SOLN
INTRAMUSCULAR | Status: AC
  Administered 2011-03-21: 3 mL via INTRAVENOUS
  Filled 2011-03-21: qty 3

## 2011-03-21 NOTE — Progress Notes (Signed)
Subjective: The patient is currently sitting up in his bed. He has no complaints of chest pain shortness of breath   Objective: Vital signs in last 24 hours: Filed Vitals:   03/20/11 1400 03/20/11 2200 03/21/11 0600 03/21/11 1400  BP: 112/66 111/67 116/65 109/63  Pulse: 97 107 104 86  Temp: 97.3 F (36.3 C) 97.9 F (36.6 C) 97.4 F (36.3 C) 98.7 F (37.1 C)  TempSrc: Oral Oral Oral Oral  Resp: 16 18 20 18   Height:      Weight:      SpO2: 100% 100% 100% 99%    Intake/Output Summary (Last 24 hours) at 03/21/11 1756 Last data filed at 03/21/11 1251  Gross per 24 hour  Intake    836 ml  Output    877 ml  Net    -41 ml    Weight change:   General appearance: alert, cooperative and appears stated age Resp: clear to auscultation bilaterally Cardio: irregularly irregular rhythm GI: soft, non-tender; bowel sounds normal; no masses,  no organomegaly Extremities:  bilateral amputee, below the knee. There is a small scab on the right, nonbleeding and non-erythematous. No stump edema.  Lab Results:  [facility] 03/21/11 0447 03/19/11 1635  NA 138 140  K 4.3 4.3  CL 102 104  CO2 24 25  GLUCOSE 120* 181*  BUN 28* 38*  CREATININE 1.04 1.49*  CALCIUM 10.0 9.4  ALKPHOS -- --  BILITOT -- --  AST -- --  ALT -- --  PROT -- --  ALBUMIN -- --    Basename 03/21/11 0447 03/19/11 1635  WBC 5.3 3.4*  NEUTROABS -- --  HGB 11.4* 10.0*  MCV 92.3 92.1  PLT 175 180   No results found for this basename: TSH:2,FT4:2,FT3:2 LIPASE:2,FERRITN:2, IRON:2,TIBC:2,AMYLASE:2, INR:2,PTT:2 in the last 72 hours    Studies/Results: No results found.  Medications: I have reviewed the patient's current medications.  Assessment:  UTI (lower urinary tract infection): Stable on Rocephin.   DM (diabetes mellitus) type II controlled with renal manifestation.  Improved on SSI.   CKD (chronic kidney disease), stage III; previously treated with dialysis. Improved creatinine today.   HTN  (hypertension). Controlled.   Leukopenia. Resolved.   CHF (congestive heart failure), NYHA class IV. Compensated. On cardiac meds.   Anemia associated with chronic renal failure. Stable.   Frequent falls: Pt will need ongoing rehab as he left the SNF AMA last week. He has had frequent falls in his wheelchair. Pt and family are in agreement with his return to SNF for strengthening  And prosthesis mobility.    Plan:  Continue current treatment. Await SNF placement.  LOS: 2 days   Vir Whetstine 03/21/2011, 5:56 PM

## 2011-03-22 ENCOUNTER — Encounter (HOSPITAL_COMMUNITY): Payer: Self-pay | Admitting: Internal Medicine

## 2011-03-22 LAB — URINE CULTURE: Culture  Setup Time: 201207232346

## 2011-03-22 LAB — GLUCOSE, CAPILLARY: Glucose-Capillary: 217 mg/dL — ABNORMAL HIGH (ref 70–99)

## 2011-03-22 MED ORDER — TORSEMIDE 20 MG PO TABS
50.0000 mg | ORAL_TABLET | Freq: Every day | ORAL | Status: DC
  Administered 2011-03-22: 50 mg via ORAL
  Filled 2011-03-22: qty 3

## 2011-03-22 MED ORDER — INSULIN GLARGINE 100 UNIT/ML ~~LOC~~ SOLN
10.0000 [IU] | Freq: Two times a day (BID) | SUBCUTANEOUS | Status: DC
  Administered 2011-03-22: 10 [IU] via SUBCUTANEOUS
  Filled 2011-03-22: qty 3

## 2011-03-22 MED ORDER — INSULIN ASPART 100 UNIT/ML ~~LOC~~ SOLN: 0.0000 [IU] | Freq: Two times a day (BID) | SUBCUTANEOUS | Status: AC

## 2011-03-22 MED ORDER — CEFUROXIME AXETIL 500 MG PO TABS: 500.0000 mg | ORAL_TABLET | Freq: Two times a day (BID) | ORAL | Status: AC

## 2011-03-22 MED ORDER — INSULIN GLARGINE 100 UNIT/ML ~~LOC~~ SOLN: 10.0000 [IU] | Freq: Two times a day (BID) | SUBCUTANEOUS | Status: AC

## 2011-03-22 NOTE — Progress Notes (Signed)
Inpatient Diabetes Program Recommendations  AACE/ADA: New Consensus Statement on Inpatient Glycemic Control (2009)  Target Ranges:  Prepandial:   less than 140 mg/dL      Peak postprandial:   less than 180 mg/dL (1-2 hours)      Critically ill patients:  140 - 180 mg/dL   Reason for Visit: Elevated prandial glucose:  7/24: 141, 215, 137, 206 mg/dL  Inpatient Diabetes Program Recommendations Correction (SSI): Agree with correction scale Insulin - Meal Coverage: May consider adding Novolog 2 unit TID for meal coverage

## 2011-03-22 NOTE — Consult Note (Addendum)
CSW presented bed offers to pt.  No beds available in [facility] so pt chooses [facility] [place].  Pt states he will notify his daughter and family. He will transfer via [facility].  Pt's daughter Kennith Center did call CSW to confirm d/c to [facility]. She is somewhat concerned about financial responsibility and CSW advised Kennith Center to call facility and DSS to determine pt's responsibility as family reports pt has Medicaid, but no documentation of that in hospital.    Karn Cassis

## 2011-03-22 NOTE — Progress Notes (Signed)
03/22/11 1634 Patient left floor in stable condition via EMS transport.

## 2011-03-22 NOTE — Progress Notes (Signed)
03/22/11 1555 Patient being discharged to [facility], [place] this afternoon. IV site d/c'd by [facility], NT, site within normal limits, gauze dressing dry and intact. Called report to [facility] receiving nurse at [facility] this afternoon at 1550. Patient in stable condition awaiting EMS transport.

## 2011-03-22 NOTE — Discharge Summary (Signed)
Physician Discharge Summary  Codey Burling MRN: [id number] DOB/AGE: 11-23-1924 77 y.o.  PCP: No primary provider on file.   Admit date: 03/19/2011 Discharge date: 03/22/2011  Discharge Diagnoses:  1. Urinary tract infection. 2. Debility with frequent falls. 3. Type 2 diabetes mellitus with diabetic nephropathy. 4. Transient leukopenia, resolved. 5. Anemia associated with chronic renal failure. 6. Stage III chronic kidney disease, status post dialysis during the previous hospitalization for acute renal failure. Dialysis has been discontinued. 7. Chronic systolic congestive heart failure/cardiomyopathy with ejection fraction of 10-15%. This remains stable and compensated during the hospitalization. 8. Hypertension, well controlled.    Current Discharge Medication List    START taking these medications   Details  cefUROXime (CEFTIN) 500 MG tablet Take 1 tablet (500 mg total) by mouth 2 (two) times daily. Qty: 10 tablet, Refills: 0      CONTINUE these medications which have CHANGED   Details  insulin aspart (NOVOLOG) 100 UNIT/ML injection Inject 0-9 Units into the skin 2 (two) times daily with a meal. Qty: 10 mL    insulin glargine (LANTUS) 100 UNIT/ML injection Inject 10 Units into the skin 2 (two) times daily. Qty: 10 mL      CONTINUE these medications which have NOT CHANGED   Details  acetaminophen (TYLENOL) 325 MG tablet Take 650 mg by mouth every 4 (four) hours as needed. pain     feeding supplement (NEPRO CARB STEADY) LIQD Take 237 mLs by mouth 2 (two) times daily.      metoprolol tartrate (LOPRESSOR) 25 MG tablet Take 25 mg by mouth.     simvastatin (ZOCOR) 40 MG tablet Take 40 mg by mouth.      spironolactone (ALDACTONE) 25 MG tablet Take 25 mg by mouth.      albuterol (PROVENTIL) (2.5 MG/3ML) 0.083% nebulizer solution Take 2.5 mg by nebulization every 4 (four) hours as needed. Shortness of breath Check pulse rate if jittery 1/2 hour after treaatment       aspirin 81 MG tablet Take 81 mg by mouth daily.      atorvastatin (LIPITOR) 20 MG tablet Take 20 mg by mouth at bedtime.      diltiazem (CARDIZEM) 120 MG tablet Take 120 mg by mouth once.      lisinopril (PRINIVIL,ZESTRIL) 20 MG tablet Take 20 mg by mouth.      multivitamin (RENA-VIT) TABS tablet Take 1 tablet by mouth daily.      polyethylene glycol (MIRALAX / GLYCOLAX) packet Take 17 g by mouth daily. For constipation     senna-docusate (SENOKOT-S) 8.6-50 MG per tablet Take 1 tablet by mouth at bedtime. constipation     torsemide (DEMADEX) 100 MG tablet Take 50 mg by mouth daily.        STOP taking these medications     UNKNOWN TO PATIENT      furosemide (LASIX) 20 MG tablet         Discharge Condition: Stable and improved  Disposition: Skilled Nursing Facility   Consults: None   Significant Diagnostic Studies: No results found.   Microbiology: Recent Results (from the past 240 hour(s))  URINE CULTURE     Status: Normal   Collection Time   03/19/11  6:54 PM      Component Value Range Status Comment   Specimen Description URINE, CLEAN CATCH   Final    Special Requests NONE   Final    Setup Time [id number]   Final    Colony Count 5,000 COLONIES/ML  Final    Culture INSIGNIFICANT GROWTH   Final    Report Status 03/22/2011 FINAL   Final      Labs: Results for orders placed during the hospital encounter of 03/19/11 (from the past 48 hour(s))  GLUCOSE, CAPILLARY     Status: Abnormal   Collection Time   03/20/11  4:02 PM      Component Value Range Comment   Glucose-Capillary 106 (*) 70 - 99 (mg/dL)   GLUCOSE, CAPILLARY     Status: Abnormal   Collection Time   03/20/11  9:39 PM      Component Value Range Comment   Glucose-Capillary 153 (*) 70 - 99 (mg/dL)    Comment 1 Notify RN     CBC     Status: Abnormal   Collection Time   03/21/11  4:47 AM      Component Value Range Comment   WBC 5.3  4.0 - 10.5 (K/uL)    RBC 3.76 (*) 4.22 - 5.81 (MIL/uL)     Hemoglobin 11.4 (*) 13.0 - 17.0 (g/dL)    HCT 95.6 (*) 21.3 - 52.0 (%)    MCV 92.3  78.0 - 100.0 (fL)    MCH 30.3  26.0 - 34.0 (pg)    MCHC 32.9  30.0 - 36.0 (g/dL)    RDW 08.6 (*) 57.8 - 15.5 (%)    Platelets 175  150 - 400 (K/uL)   BASIC METABOLIC PANEL     Status: Abnormal   Collection Time   03/21/11  4:47 AM      Component Value Range Comment   Sodium 138  135 - 145 (mEq/L)    Potassium 4.3  3.5 - 5.1 (mEq/L)    Chloride 102  96 - 112 (mEq/L)    CO2 24  19 - 32 (mEq/L)    Glucose, Bld 120 (*) 70 - 99 (mg/dL)    BUN 28 (*) 6 - 23 (mg/dL)    Creatinine, Ser 4.69  0.50 - 1.35 (mg/dL) ICTERUS AT THIS LEVEL MAY AFFECT RESULT   Calcium 10.0  8.4 - 10.5 (mg/dL)    GFR calc non Af Amer >60  >60 (mL/min)    GFR calc Af Amer >60  >60 (mL/min)   GLUCOSE, CAPILLARY     Status: Abnormal   Collection Time   03/21/11  7:47 AM      Component Value Range Comment   Glucose-Capillary 141 (*) 70 - 99 (mg/dL)   GLUCOSE, CAPILLARY     Status: Abnormal   Collection Time   03/21/11 11:30 AM      Component Value Range Comment   Glucose-Capillary 215 (*) 70 - 99 (mg/dL)   GLUCOSE, CAPILLARY     Status: Abnormal   Collection Time   03/21/11  5:10 PM      Component Value Range Comment   Glucose-Capillary 137 (*) 70 - 99 (mg/dL)   GLUCOSE, CAPILLARY     Status: Abnormal   Collection Time   03/21/11 10:22 PM      Component Value Range Comment   Glucose-Capillary 206 (*) 70 - 99 (mg/dL)   GLUCOSE, CAPILLARY     Status: Abnormal   Collection Time   03/22/11  7:24 AM      Component Value Range Comment   Glucose-Capillary 131 (*) 70 - 99 (mg/dL)    Comment 1 Documented in Chart      Comment 2 Notify RN     GLUCOSE, CAPILLARY     Status: Abnormal  Collection Time   03/22/11 11:24 AM      Component Value Range Comment   Glucose-Capillary 217 (*) 70 - 99 (mg/dL)    Comment 1 Notify RN      Comment 2 Documented in Chart        HPI : The patient is an 77 year old man with a past medical history  significant for a recent right below-the-knee amputation secondary to gangrene and osteomyelitis on 01/03/2011, severe chronic congestive heart failure, and type 2 diabetes mellitus, who presented to the emergency department on 03/19/2011 for evaluation of recent falls. The patient was discharged to a local skilled nursing facility in May following his long hospitalization. Apparently, it was reported that he left the skilled nursing facility AGAINST MEDICAL ADVICE. Following his return home, he had fallen out of his wheelchair on several occasions. In fact, at one point, he was missing for hours but then later found in a ditch because he had fallen out of his wheelchair. He has become more debilitated. His wife who is elderly and frail believes he needs to return to their skilled nursing facility for further rehabilitation. The plan was to get him fitted for prosthesis of his lower extremities. The patient was admitted for further evaluation and management.  HOSPITAL COURSE:   The patient was hemodynamically stable and afebrile at the time of the hospital admission. His lab data were significant for a creatinine of 1.49 and a urinalysis that revealed too numerous to count white blood cells and many bacteria. He was therefore started on Cipro. However, the next day, Cipro was discontinued in favor of intravenous Rocephin. A urine culture was ordered and it revealed insignificant growth. After 2 days of intravenous Rocephin therapy, he was discharged to the skilled nursing facility on 5 more days of Ceftin.  All of the patient's chronic medical conditions remained stable. His creatinine actually improved to 1.04. He was dialyzed during the previous hospitalization in May 2012 because of acute renal failure. He has a history of underlying stage III chronic kidney disease. His blood pressure remained stable. His capillary blood glucose did increase a little, however, Lantus was not restarted until today. His  congestive heart failure remained compensated. His white blood cell count transiently fell to the leukopenic range, however, the followup WBC normalized.  The plan is to discharge the patient to a skilled nursing facility of choice in [facility] today. He is in improved and stable condition.    Discharge Exam:   Blood pressure 132/81, pulse 78, temperature 97.5 F (36.4 C), temperature source Oral, resp. rate 18, height 4\' 4"  (1.321 m), weight 59.1 kg (130 lb 4.7 oz), SpO2 100.00%. General: The patient is currently sitting up in bed reading. He is in no acute distress. HEENT: Head is normocephalic nontraumatic. 2 both equal round reactive to light. Extraocular movement are intact. Oropharynx reveals moist mucous membranes. Lungs: Clear to auscultation bilaterally. Heart: Irregular irregular. Abdomen: Positive bowel sounds, soft nontender, nondistended no hepatosplenomegaly no masses palpated. Extremities: Bilateral stumps in place. Mild excoriated lesion on the right stump, nonbleeding and nonerythematous.       Discharge Orders    Future Orders Please Complete By Expires   Diet - low sodium heart healthy      Diet Carb Modified      Increase activity slowly         Follow-up Information    Please follow up. (If symptoms worsen)          Signed: Elliot Cousin  03/22/2011, 1:30 PM

## 2011-04-27 NOTE — Progress Notes (Signed)
Encounter addended by: Clarene Critchley on: 04/27/2011  7:04 AM<BR>     Documentation filed: Flowsheet VN

## 2011-05-05 DIAGNOSIS — I509 Heart failure, unspecified: Secondary | ICD-10-CM

## 2011-08-29 DEATH — deceased

## 2012-02-14 IMAGING — XA IR FLUORO GUIDE CV LINE*R*
1 series · 2 of 2 positions shown · non-contrast
Comparison: none

CLINICAL DATA: End-stage renal disease, access for dialysis

[Series 1: run · 2 of 2 slices shown]
[im 1/2]
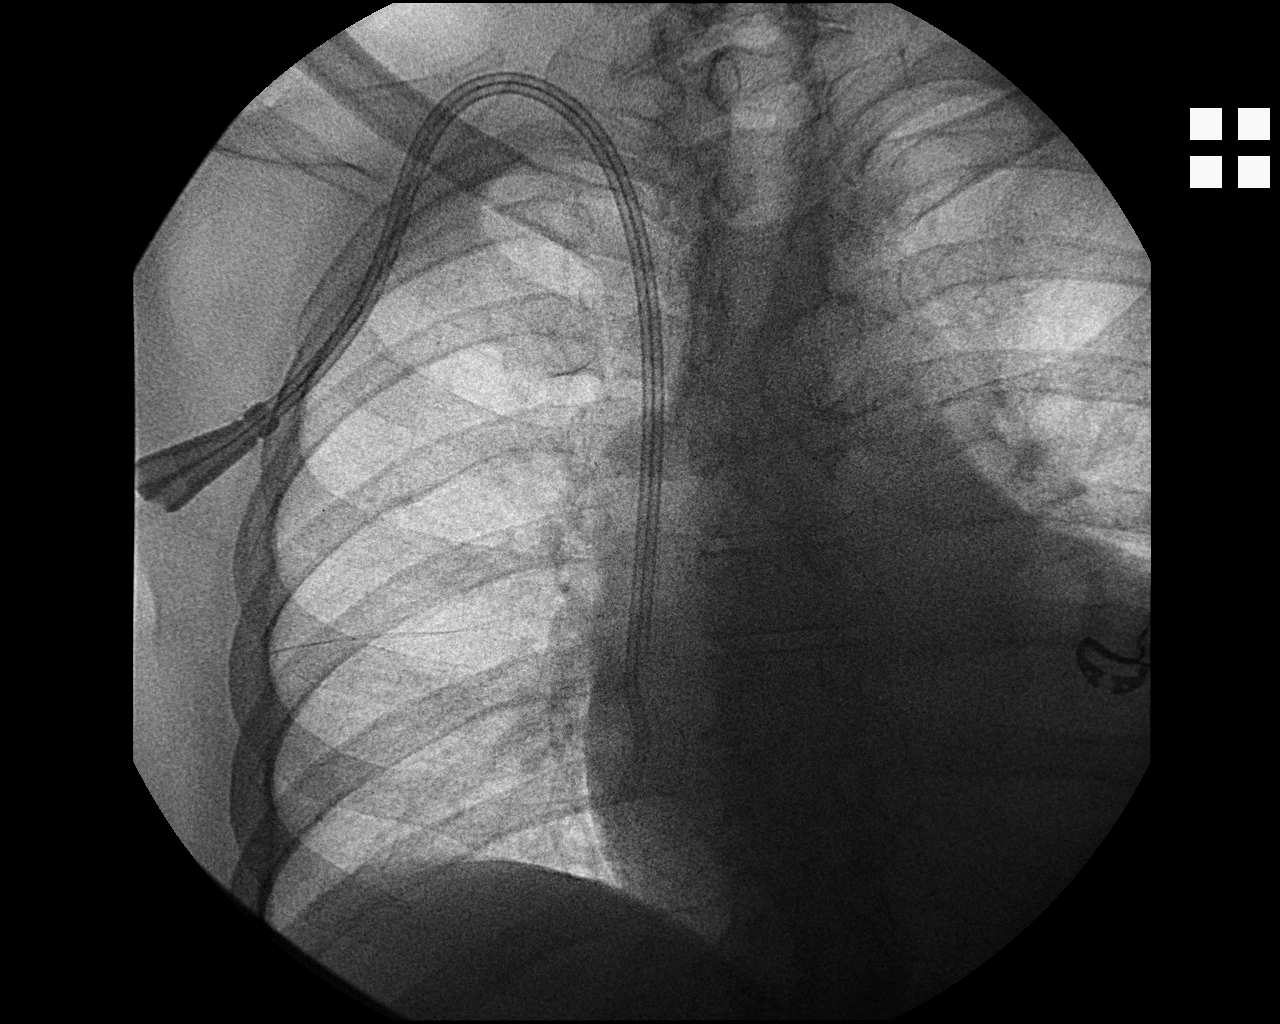
[im 2/2]
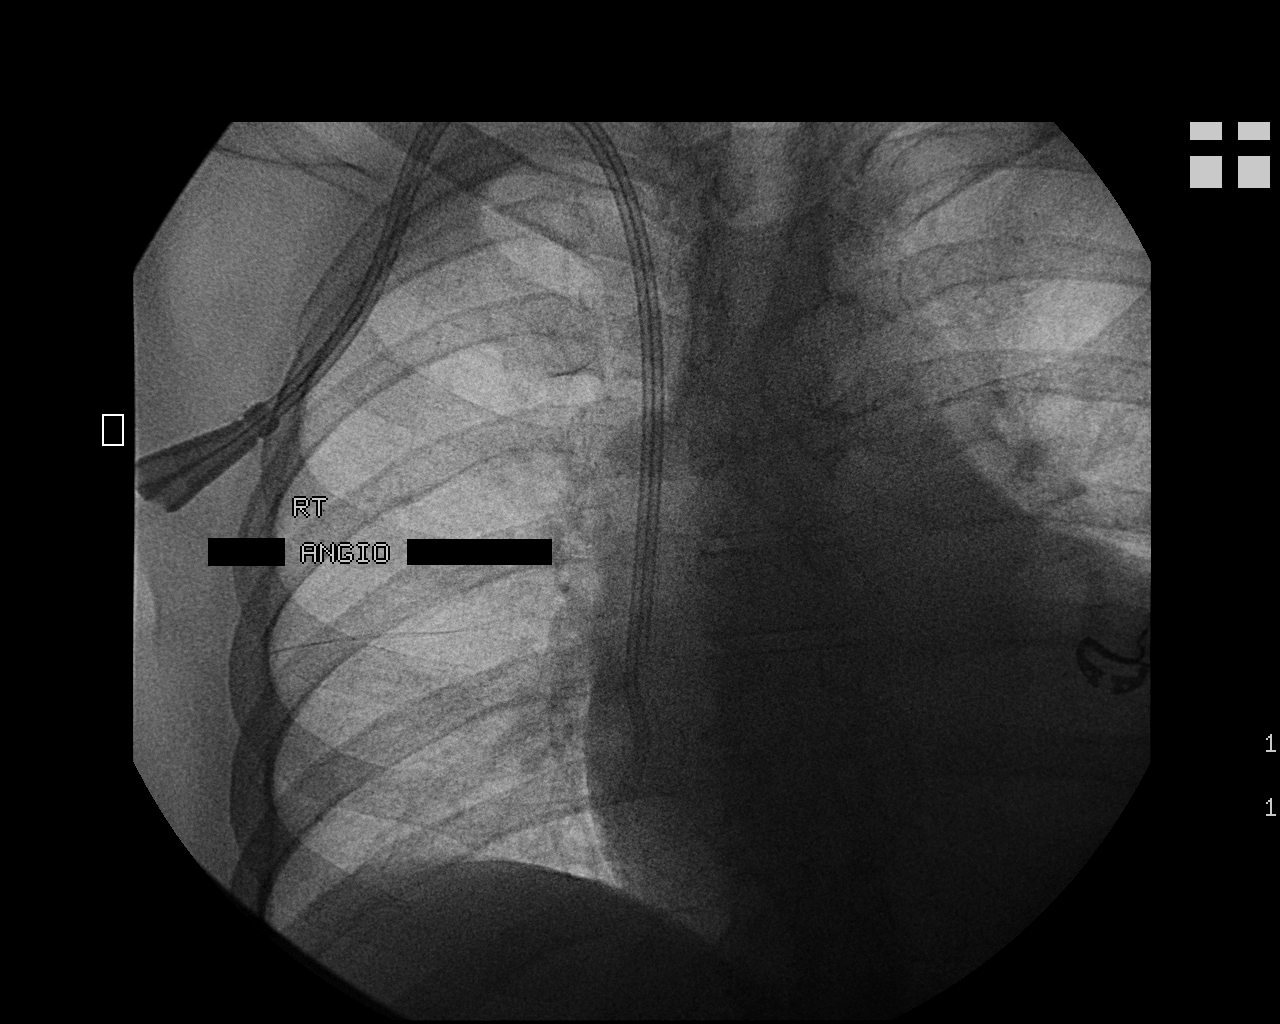

[2 of 2 positions shown; findings below may reference images not displayed]

ULTRASOUND GUIDANCE FOR VASCULAR ACCESS
RIGHT INTERNAL JUGULAR PERMANENT HEMODIALYSIS CATHETER

Date:  01/17/2011 [DATE]

Radiologist:  Chaparrita Colucci, M.D.

Medications:  1 gram ancefadministered within 1 hour of the
procedure,2 mg Versed, 50 mcg Fentanyl

Guidance:  Ultrasound fluoroscopic

Fluoroscopy time:  0.5 minutes

Sedation time:  18 minutes

Contrast volume:  None.

Complications:  No immediate

PROCEDURE/FINDINGS:

Informed consent was obtained from the patient following
explanation of the procedure, risks, benefits and alternatives.
The patient understands, agrees and consents for the procedure.
All questions were addressed.  A time out was performed.

Maximal barrier sterile technique utilized including caps, mask,
sterile gowns, sterile gloves, large sterile drape, hand hygiene,
and 2% chlorhexidine scrub.

Under sterile conditions and local anesthesia, right internal
jugular micropuncture venous access was performed with ultrasound.
Images were obtained for documentation.  A guide wire was inserted
followed by a transitional dilator.  Next, a 0.035 guidewire was
advanced into the IVC with a 5-French catheter.  Measurements were
obtained from the right venotomy site to the proximal right atrium.
In the right infraclavicular chest, a subcutaneous tunnel was
created under sterile conditions and local anesthesia.  1%
lidocaine with epinephrine was utilized for this.  The dura max
15.5 French 23 cm tip to cuff catheter was tunneled subcutaneously
to the venotomy site and inserted into the SVC/RA junction through
a valved peel-away sheath.  Position was confirmed with
fluoroscopy.  Images were obtained for documentation.  Blood was
aspirated from the catheter followed by saline and heparin flushes.
The appropriate volume and strength of heparin was instilled in
each lumen.  Caps were applied.  The catheter was secured at the
tunnel site with Gelfoam and a pursestring suture.  The venotomy
site was closed with subcuticular Vicryl suture.  Dermabond was
applied to the small right neck incision.  A dry sterile dressing
was applied.  The catheter is ready for use.  No immediate
complications.
IMPRESSION: Ultrasound and fluoroscopically guided right internal jugular
tunneled hemodialysis catheter (dural max 23 cm tip to cuff
catheter).

## 2012-06-01 IMAGING — US US RENAL KIDNEY
1 series · 17 of 25 positions shown · non-contrast
Comparison: none

REASON FOR EXAM: renal failure.  evaluate.
COMMENTS:

PROCEDURE:     US  - US KIDNEY  - May 05, 2011 [DATE]
RESULT:

[Series 1: us renal kidney · 17 of 61 slices shown]
[im 1/61]
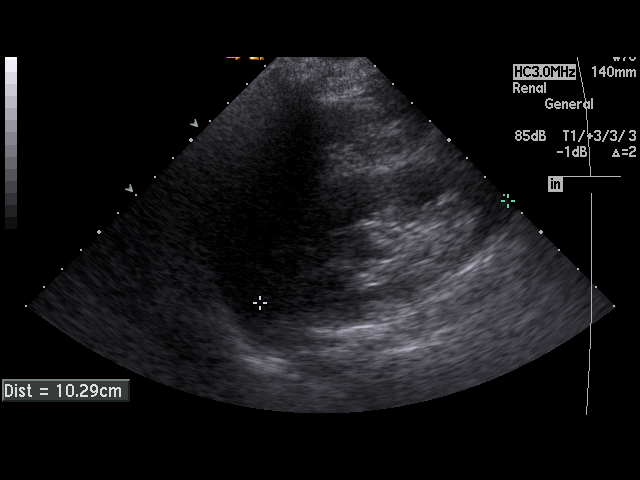
[im 6/61]
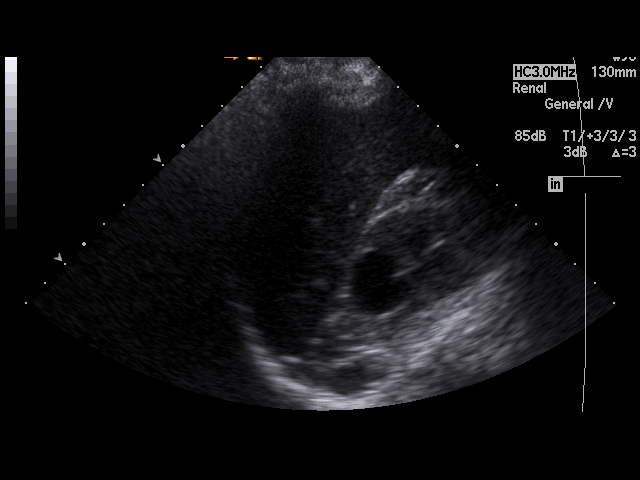
[im 8/61]
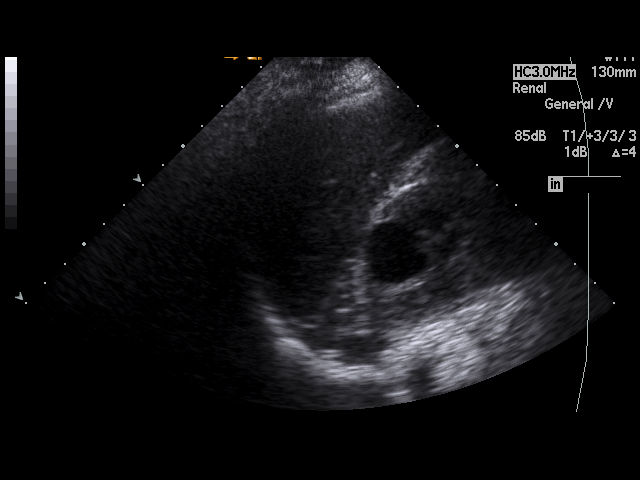
[im 13/61]
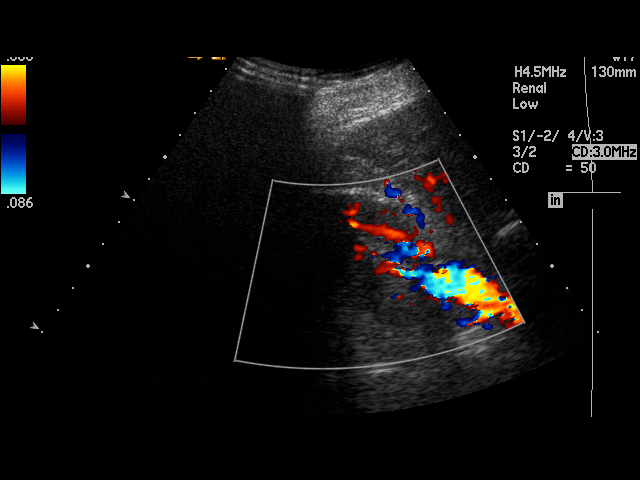
[im 16/61]
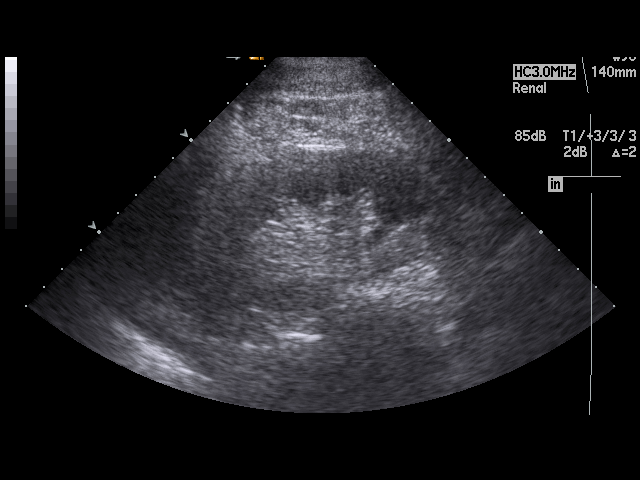
[im 21/61]
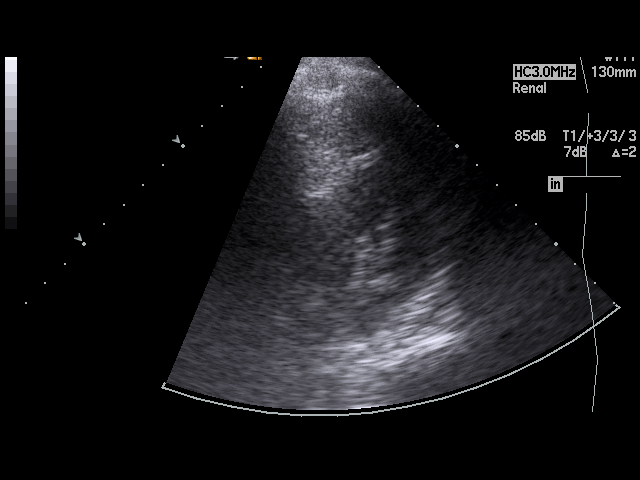
[im 23/61]
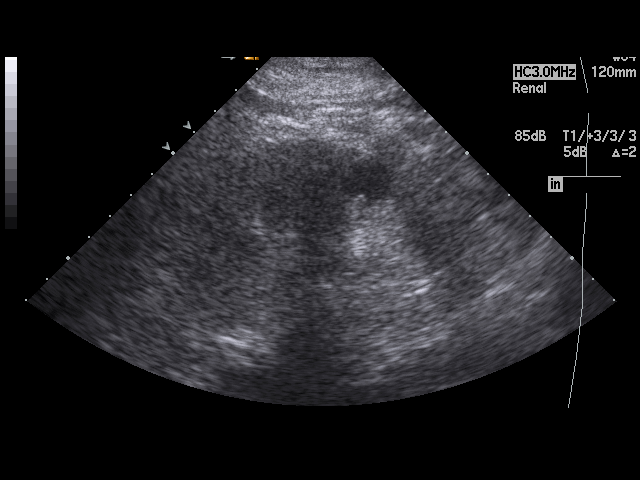
[im 28/61]
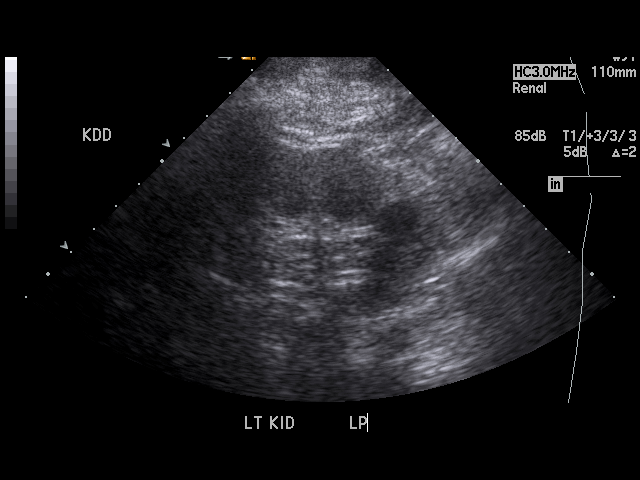
[im 31/61]
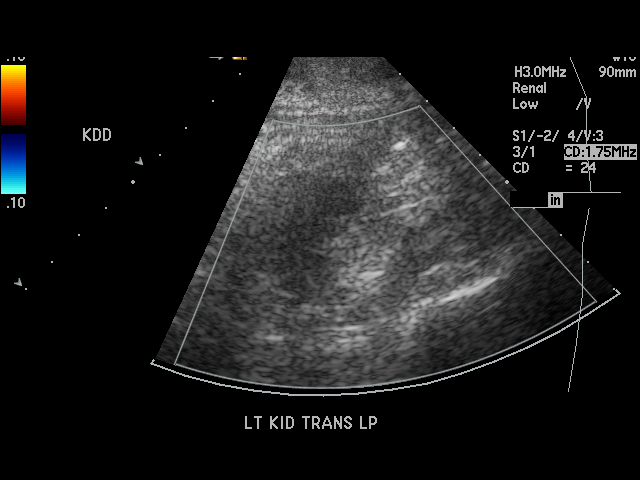
[im 33/61]
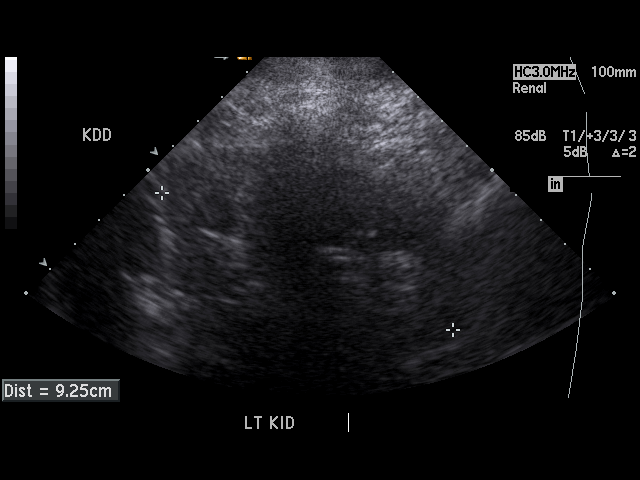
[im 38/61]
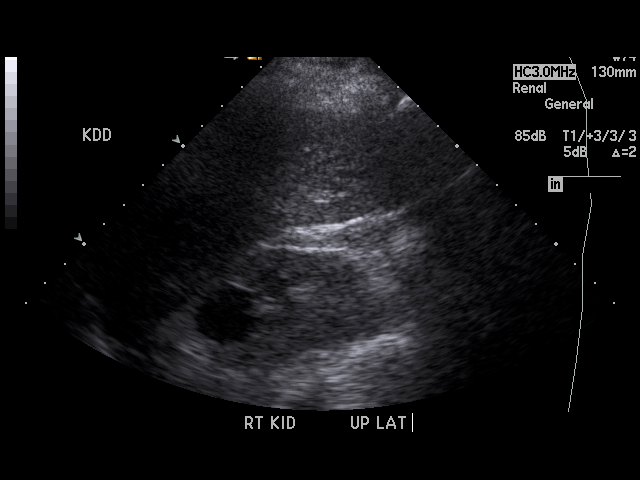
[im 41/61]
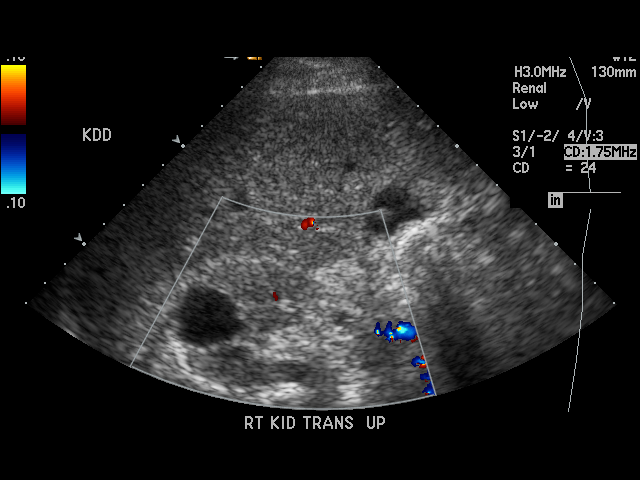
[im 46/61]
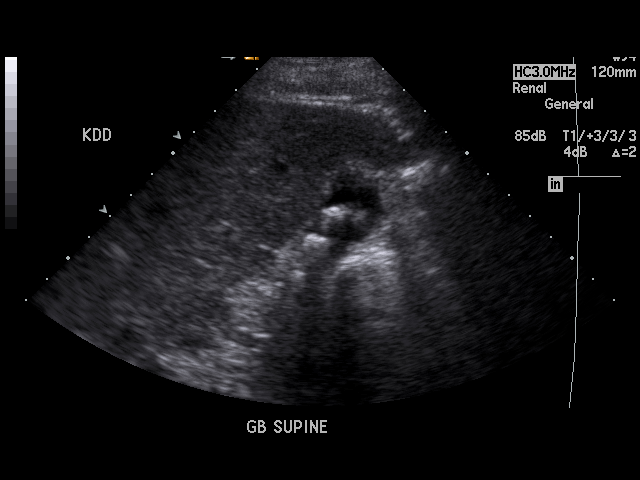
[im 48/61]
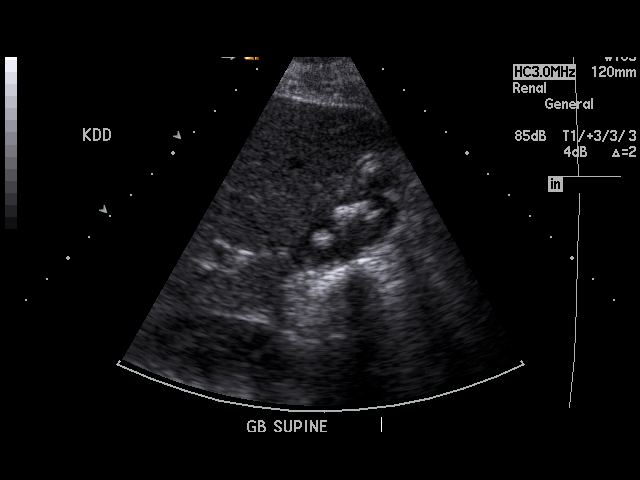
[im 53/61]
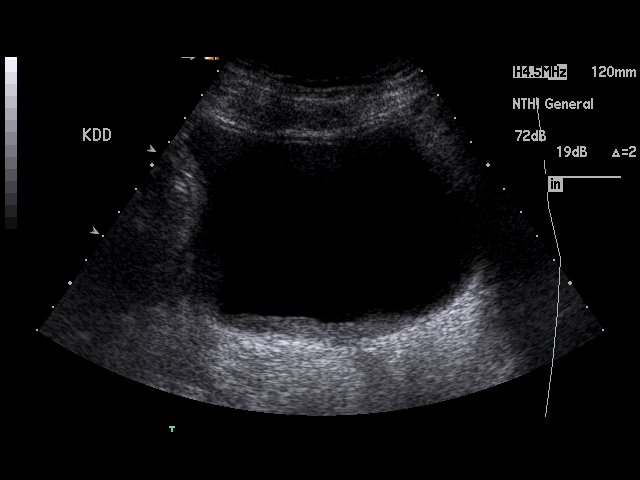
[im 56/61]
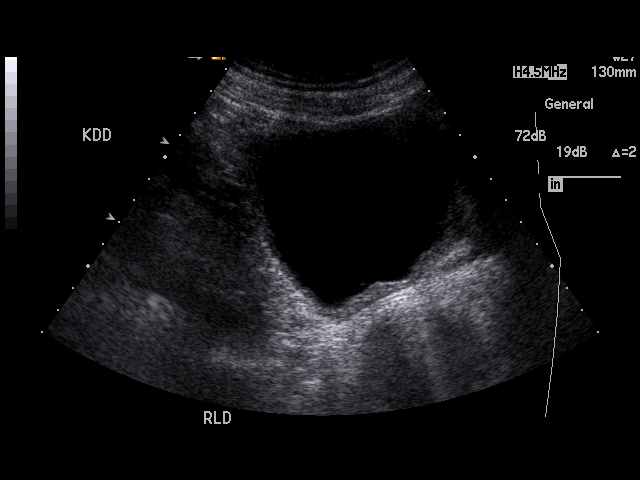
[im 61/61]
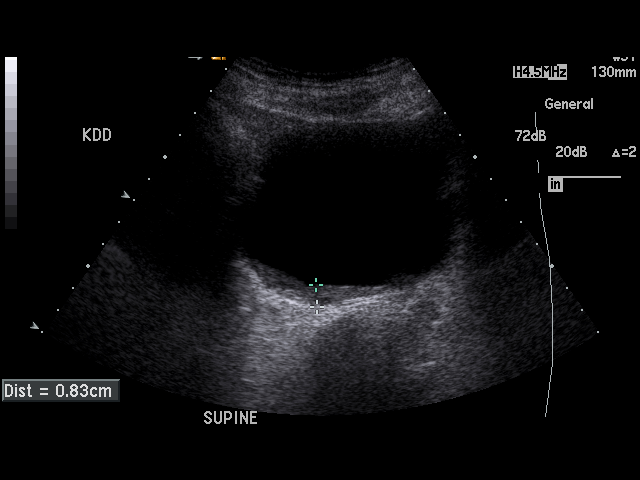

[17 of 25 positions shown; findings below may reference images not displayed]

FINDINGS: The right kidney measures 10.29 x 5.38 x 4.59 cm. A right renal
cyst is identified within the upper pole measuring 2.34 x 2.33 x 2.05 cm.
The corticomedullary differentiation is maintained. There is no evidence of
hydronephrosis. The left kidney measures 9.25 x 4.77 x 4.62 cm. A cyst is
identified within the lower pole measuring 1.28 x 1.58 x 1.37 cm. The
corticomedullary differentiation is maintained. There is no evidence of
hydronephrosis.

Incidental note is made of gallstones within the gallbladder.

Evaluation of the urinary bladder demonstrates findings concerning for
bladder wall thickening upon the posterior aspect of the bladder. Bilateral
ureteral jets are maintained. The urinary bladder is distended with urine.
This may represent the patient's need to void or possibly bladder outlet
obstruction if clinically appropriate.
IMPRESSION: 1. Bilateral renal cysts and otherwise unremarkable evaluation of the
kidneys.
2. Known gallstones.
3. Distended urinary bladder which may represent the patient's need to void
or possibly a component of bladder outlet obstruction.
4. Thickening of the posterior wall of the urinary bladder, nonspecific
finding, and if clinically warranted direct visualization recommended.

## 2012-06-01 IMAGING — CT CT HEAD WITHOUT CONTRAST
2 series · 15 of 30 positions shown, 19 images · non-contrast
Comparison: none

REASON FOR EXAM: confusion.  evaluat for cva
COMMENTS:

PROCEDURE:     CT  - CT HEAD WITHOUT CONTRAST  - May 05, 2011 [DATE]
RESULT:     Comparison:  None
TECHNIQUE: Multiple axial images from the foramen magnum to the vertex were
obtained without IV contrast.

[Series 5: without repeat · axial · non-contrast · 0.42mm/px · z∈[-70,+50]mm · 13 of 30 slices shown, 17 images]
[im 3/30  brain]
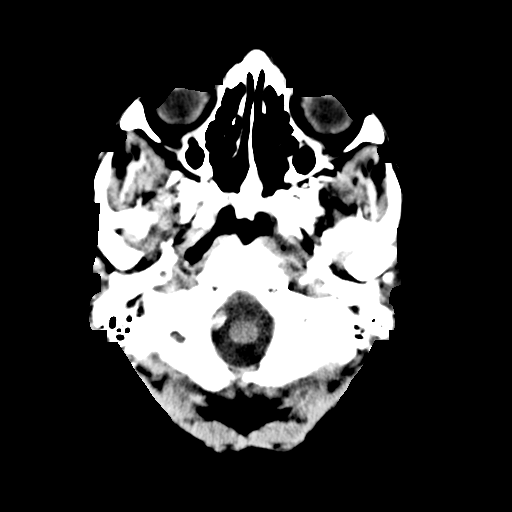
[im 3/30  bone]
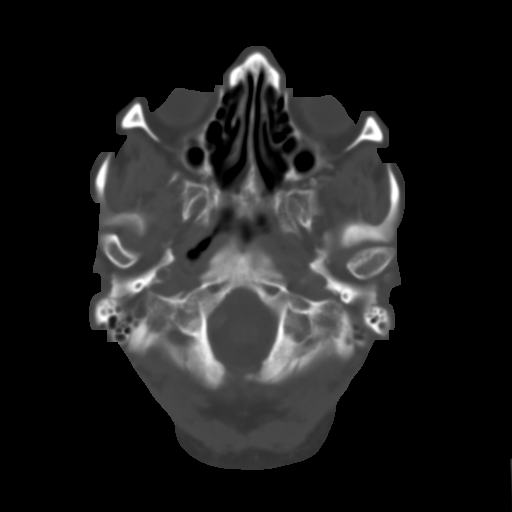
[im 5/30  brain]
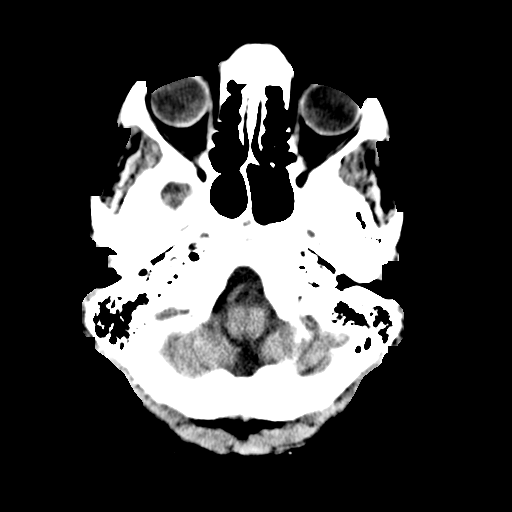
[im 7/30  brain]
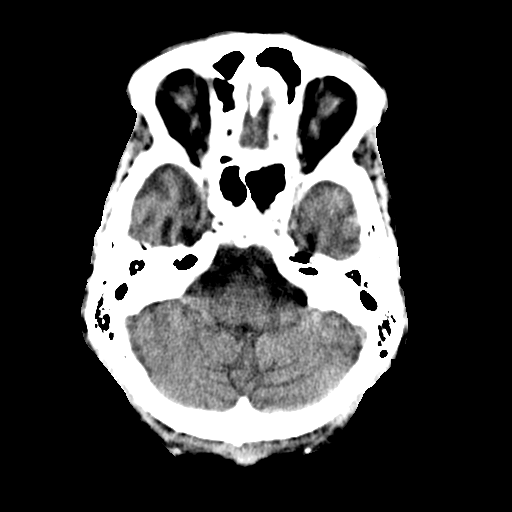
[im 9/30  brain]
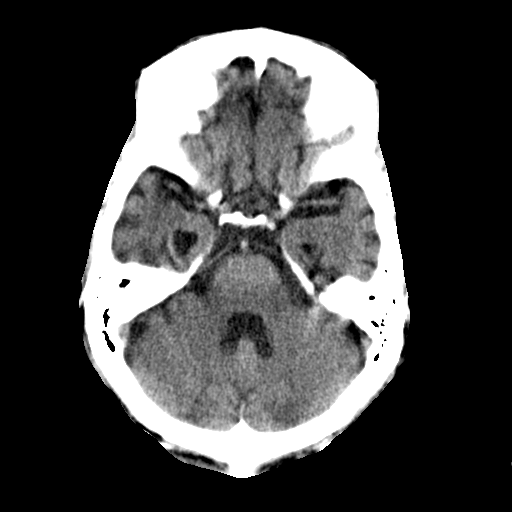
[im 11/30  brain]
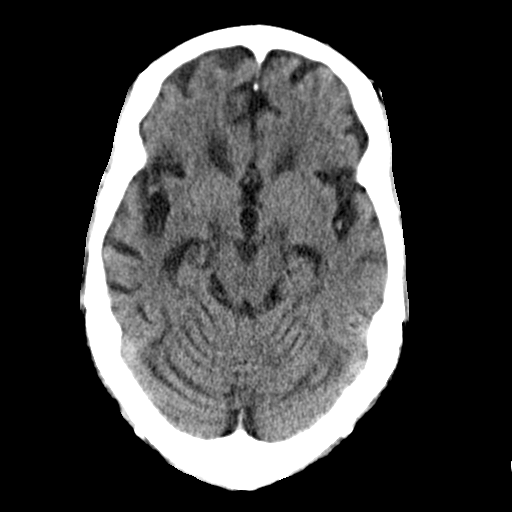
[im 11/30  bone]
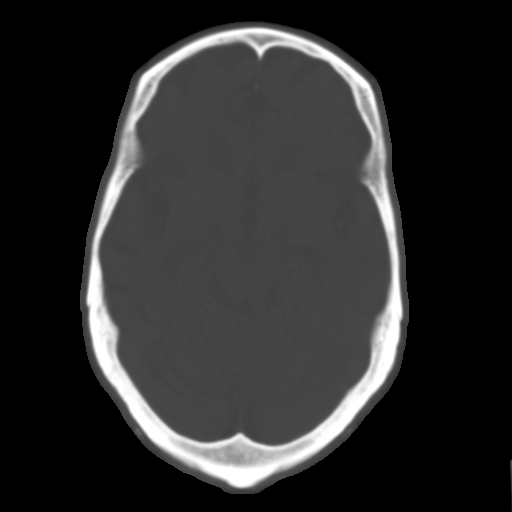
[im 13/30  brain]
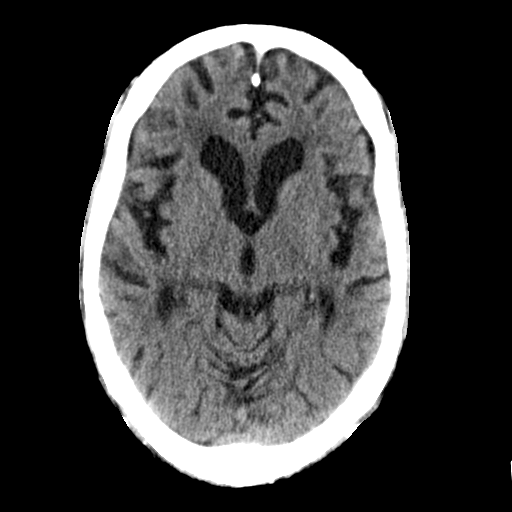
[im 15/30  brain]
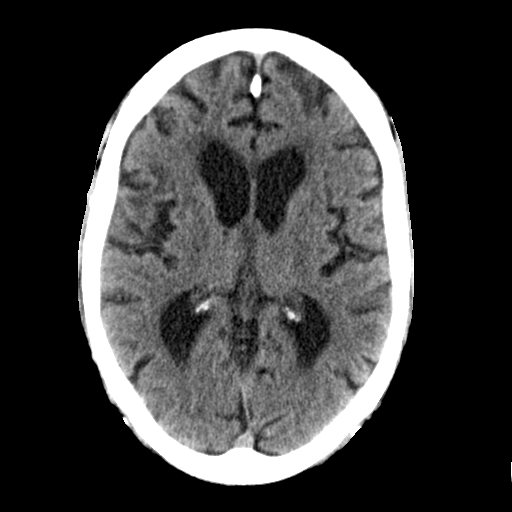
[im 17/30  brain]
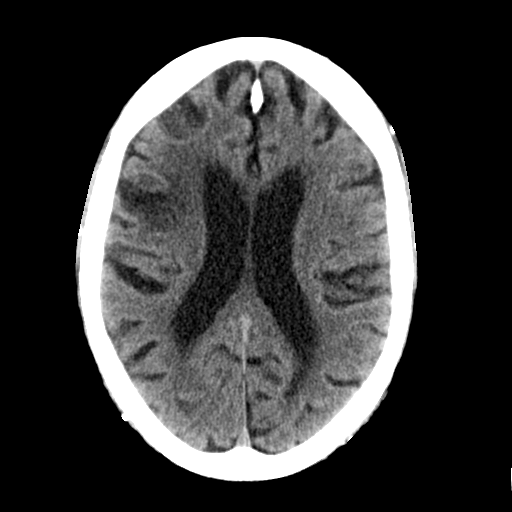
[im 19/30  brain]
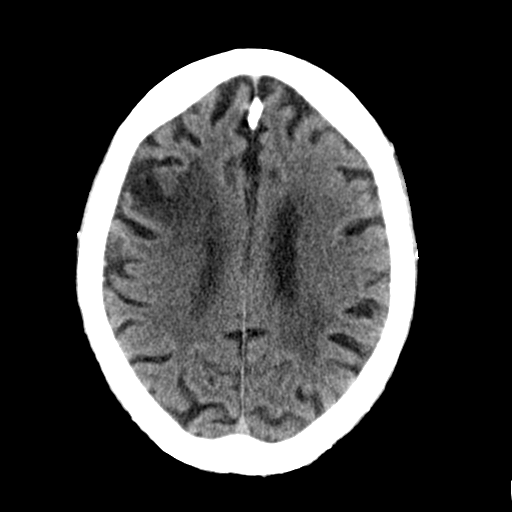
[im 19/30  bone]
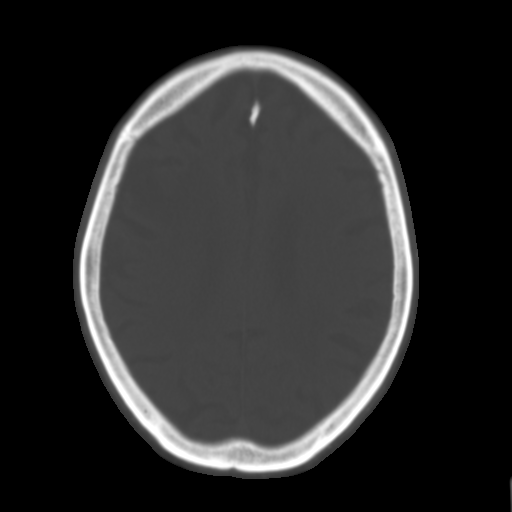
[im 21/30  brain]
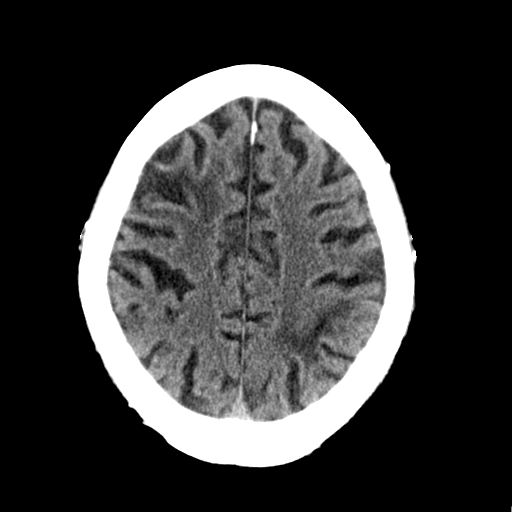
[im 23/30  brain]
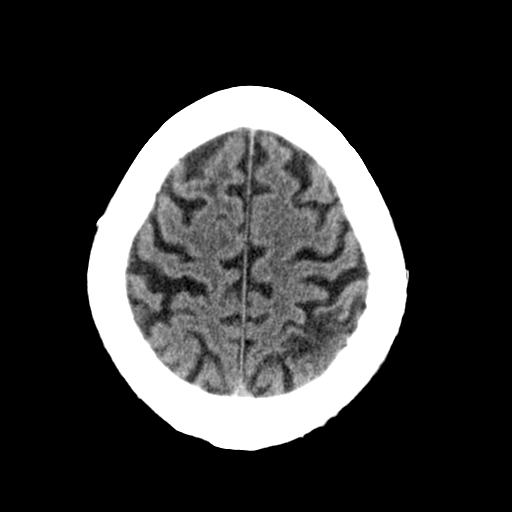
[im 25/30  brain]
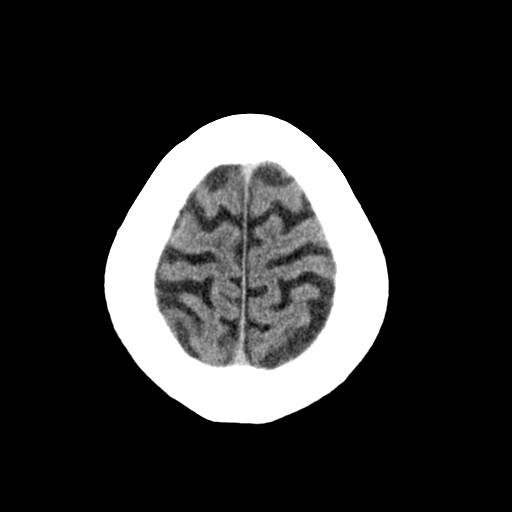
[im 27/30  brain]
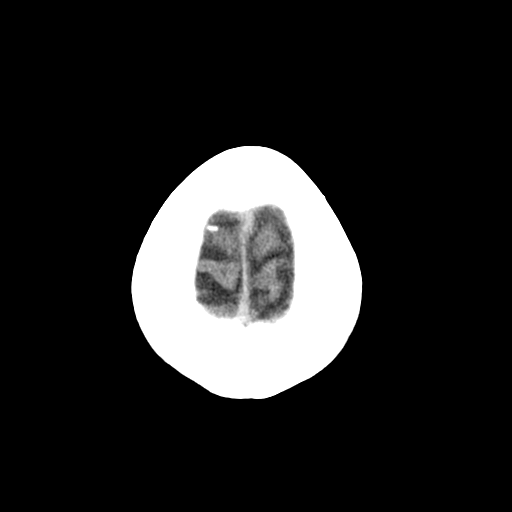
[im 27/30  bone]
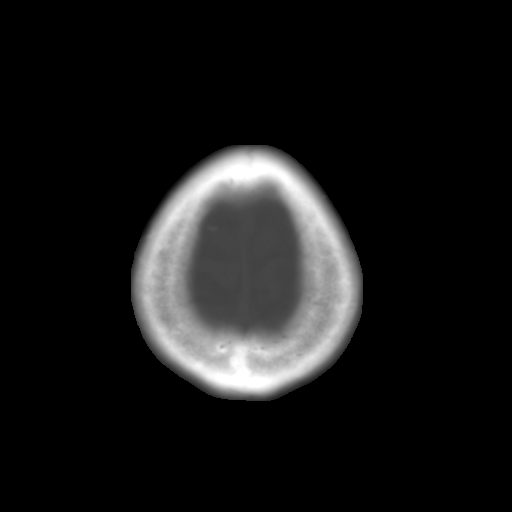

[Series 6: bone repeat · axial · 0.42mm/px · z∈[-70,-50]mm · 2 of 30 slices shown]
[im 3/30  bone]
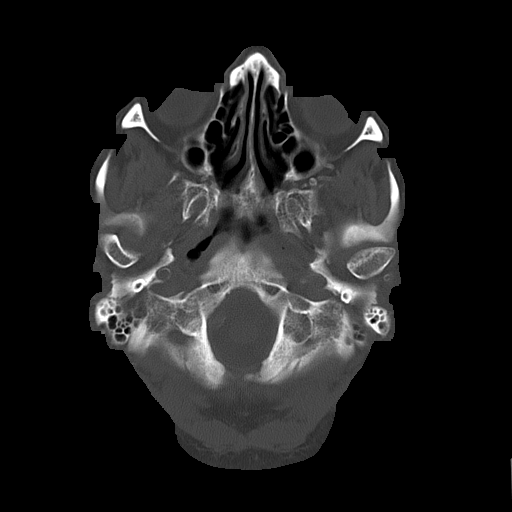
[im 7/30  bone]
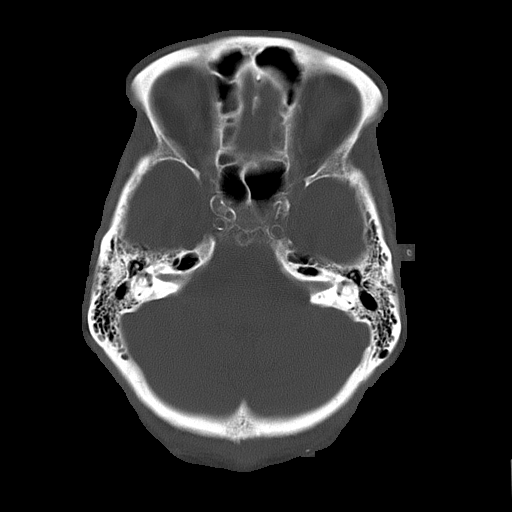

[15 of 30 positions shown; findings below may reference images not displayed]

FINDINGS: There is no evidence of mass effect, midline shift, or extra-axial fluid
collections.  There is no evidence of a space-occupying lesion or
intracranial hemorrhage. There is no evidence of a cortical-based area of
acute infarction. There is an old right frontal infarct. There is
generalized cerebral atrophy. There is periventricular white matter low
attenuation likely secondary to microangiopathy.

The ventricles and sulci are appropriate for the patient's age. The basal
cisterns are patent.

Visualized portions of the orbits are unremarkable. The visualized portions
of the paranasal sinuses and mastoid air cells are unremarkable.
Cerebrovascular atherosclerotic calcifications are noted.

The osseous structures are unremarkable.
IMPRESSION: No acute intracranial process.

CT can underestimate ischemia in the first 24 hours after the event. If
there is clinical concern for an acute infarct, a followup MRI or repeat CT
scan in 24 hours may provide additional information.
